# Patient Record
Sex: Female | Born: 1935 | Race: White | Hispanic: No | Smoking: Former smoker
Health system: Southern US, Community
[De-identification: ages and names within clinical notes are randomized; demographics above are authoritative.]

## PROBLEM LIST (undated history)

## (undated) DIAGNOSIS — F0390 Unspecified dementia without behavioral disturbance: Secondary | ICD-10-CM

## (undated) DIAGNOSIS — I350 Nonrheumatic aortic (valve) stenosis: Secondary | ICD-10-CM

## (undated) DIAGNOSIS — I272 Pulmonary hypertension, unspecified: Secondary | ICD-10-CM

## (undated) DIAGNOSIS — I10 Essential (primary) hypertension: Secondary | ICD-10-CM

## (undated) DIAGNOSIS — F32A Depression, unspecified: Secondary | ICD-10-CM

## (undated) DIAGNOSIS — E785 Hyperlipidemia, unspecified: Secondary | ICD-10-CM

## (undated) DIAGNOSIS — M810 Age-related osteoporosis without current pathological fracture: Secondary | ICD-10-CM

## (undated) DIAGNOSIS — I714 Abdominal aortic aneurysm, without rupture, unspecified: Secondary | ICD-10-CM

## (undated) DIAGNOSIS — M1990 Unspecified osteoarthritis, unspecified site: Secondary | ICD-10-CM

## (undated) DIAGNOSIS — F329 Major depressive disorder, single episode, unspecified: Secondary | ICD-10-CM

## (undated) DIAGNOSIS — G4733 Obstructive sleep apnea (adult) (pediatric): Secondary | ICD-10-CM

## (undated) HISTORY — PX: CATARACT EXTRACTION: SUR2

## (undated) HISTORY — PX: APPENDECTOMY: SHX54

## (undated) HISTORY — PX: HERNIA REPAIR: SHX51

---

## 2010-01-31 ENCOUNTER — Ambulatory Visit: Payer: Self-pay | Admitting: Family Medicine

## 2010-07-31 ENCOUNTER — Ambulatory Visit: Payer: Self-pay | Admitting: Internal Medicine

## 2010-12-12 ENCOUNTER — Inpatient Hospital Stay: Payer: Self-pay | Admitting: Internal Medicine

## 2012-01-11 ENCOUNTER — Ambulatory Visit: Payer: Self-pay | Admitting: Internal Medicine

## 2012-01-11 ENCOUNTER — Other Ambulatory Visit: Payer: Self-pay | Admitting: Internal Medicine

## 2012-05-11 LAB — COMPREHENSIVE METABOLIC PANEL
Albumin: 3.2 g/dL — ABNORMAL LOW (ref 3.4–5.0)
Alkaline Phosphatase: 81 U/L (ref 50–136)
Bilirubin,Total: 0.3 mg/dL (ref 0.2–1.0)
Calcium, Total: 8.8 mg/dL (ref 8.5–10.1)
Chloride: 105 mmol/L (ref 98–107)
Creatinine: 1.37 mg/dL — ABNORMAL HIGH (ref 0.60–1.30)
EGFR (Non-African Amer.): 34 — ABNORMAL LOW
Glucose: 132 mg/dL — ABNORMAL HIGH (ref 65–99)
Osmolality: 293 (ref 275–301)
Potassium: 3.6 mmol/L (ref 3.5–5.1)
SGOT(AST): 27 U/L (ref 15–37)
SGPT (ALT): 22 U/L (ref 12–78)
Total Protein: 7.2 g/dL (ref 6.4–8.2)

## 2012-05-11 LAB — CK TOTAL AND CKMB (NOT AT ARMC): CK-MB: 1.7 ng/mL (ref 0.5–3.6)

## 2012-05-11 LAB — TROPONIN I: Troponin-I: <0.02 ng/mL

## 2012-05-11 LAB — CBC
HGB: 13.8 g/dL (ref 12.0–16.0)
MCH: 31.3 pg (ref 26.0–34.0)
MCV: 96 fL (ref 80–100)

## 2012-05-12 ENCOUNTER — Inpatient Hospital Stay: Payer: Self-pay | Admitting: Internal Medicine

## 2012-05-12 LAB — URINALYSIS, COMPLETE
Bacteria: NONE SEEN
Bilirubin,UR: NEGATIVE
Glucose,UR: NEGATIVE mg/dL (ref 0–75)
Hyaline Cast: 1
Ketone: NEGATIVE
Ph: 6 (ref 4.5–8.0)
Protein: NEGATIVE
RBC,UR: 151 /HPF (ref 0–5)
Squamous Epithelial: 3
Squamous Epithelial: <1
WBC UR: 58 /HPF (ref 0–5)
WBC UR: 6 /HPF (ref 0–5)

## 2012-05-13 LAB — LIPID PANEL
Ldl Cholesterol, Calc: 89 mg/dL (ref 0–100)
Triglycerides: 64 mg/dL (ref 0–200)
VLDL Cholesterol, Calc: 13 mg/dL (ref 5–40)

## 2012-05-13 LAB — CBC WITH DIFFERENTIAL/PLATELET
Basophil #: 0.1 10*3/uL (ref 0.0–0.1)
Basophil %: 0.9 %
Eosinophil %: 4.6 %
HCT: 40 % (ref 35.0–47.0)
HGB: 13.1 g/dL (ref 12.0–16.0)
Lymphocyte #: 2.2 10*3/uL (ref 1.0–3.6)
Lymphocyte %: 33.8 %
MCHC: 32.7 g/dL (ref 32.0–36.0)
Monocyte #: 0.6 x10 3/mm (ref 0.2–0.9)
Monocyte %: 9.3 %
Neutrophil %: 51.4 %
RBC: 4.21 10*6/uL (ref 3.80–5.20)

## 2012-05-13 LAB — BASIC METABOLIC PANEL
Anion Gap: 5 — ABNORMAL LOW (ref 7–16)
BUN: 22 mg/dL — ABNORMAL HIGH (ref 7–18)
Calcium, Total: 8.5 mg/dL (ref 8.5–10.1)
Chloride: 107 mmol/L (ref 98–107)
Co2: 30 mmol/L (ref 21–32)
Creatinine: 1 mg/dL (ref 0.60–1.30)
EGFR (African American): 57 — ABNORMAL LOW
EGFR (Non-African Amer.): 50 — ABNORMAL LOW
Glucose: 86 mg/dL (ref 65–99)
Osmolality: 286 (ref 275–301)
Potassium: 4 mmol/L (ref 3.5–5.1)
Sodium: 142 mmol/L (ref 136–145)

## 2012-05-13 LAB — URINE CULTURE

## 2012-05-14 LAB — URINE CULTURE

## 2013-02-11 ENCOUNTER — Ambulatory Visit: Payer: Self-pay | Admitting: Specialist

## 2013-11-07 ENCOUNTER — Emergency Department: Payer: Self-pay | Admitting: Emergency Medicine

## 2013-11-07 LAB — COMPREHENSIVE METABOLIC PANEL
Albumin: 2.9 g/dL — ABNORMAL LOW (ref 3.4–5.0)
Alkaline Phosphatase: 64 U/L
Anion Gap: 9 (ref 7–16)
BUN: 23 mg/dL — AB (ref 7–18)
Bilirubin,Total: 0.4 mg/dL (ref 0.2–1.0)
CALCIUM: 8.8 mg/dL (ref 8.5–10.1)
CHLORIDE: 106 mmol/L (ref 98–107)
CO2: 29 mmol/L (ref 21–32)
Creatinine: 1.37 mg/dL — ABNORMAL HIGH (ref 0.60–1.30)
EGFR (African American): 39 — ABNORMAL LOW
EGFR (Non-African Amer.): 34 — ABNORMAL LOW
Glucose: 128 mg/dL — ABNORMAL HIGH (ref 65–99)
Osmolality: 292 (ref 275–301)
Potassium: 4.2 mmol/L (ref 3.5–5.1)
SGOT(AST): 23 U/L (ref 15–37)
SGPT (ALT): 16 U/L
Sodium: 144 mmol/L (ref 136–145)
TOTAL PROTEIN: 7 g/dL (ref 6.4–8.2)

## 2013-11-07 LAB — URINALYSIS, COMPLETE
Bilirubin,UR: NEGATIVE
Blood: NEGATIVE
GLUCOSE, UR: NEGATIVE mg/dL (ref 0–75)
Ketone: NEGATIVE
NITRITE: NEGATIVE
PH: 6 (ref 4.5–8.0)
Protein: NEGATIVE
SPECIFIC GRAVITY: 1.014 (ref 1.003–1.030)
Squamous Epithelial: 3
WBC UR: 7 /HPF (ref 0–5)

## 2013-11-07 LAB — CBC
HCT: 44.6 % (ref 35.0–47.0)
HGB: 14 g/dL (ref 12.0–16.0)
MCH: 30.2 pg (ref 26.0–34.0)
MCHC: 31.4 g/dL — ABNORMAL LOW (ref 32.0–36.0)
MCV: 96 fL (ref 80–100)
PLATELETS: 241 10*3/uL (ref 150–440)
RBC: 4.64 10*6/uL (ref 3.80–5.20)
RDW: 13.8 % (ref 11.5–14.5)
WBC: 6.4 10*3/uL (ref 3.6–11.0)

## 2013-11-07 LAB — TROPONIN I: Troponin-I: 0.03 ng/mL

## 2014-01-02 ENCOUNTER — Emergency Department: Payer: Self-pay | Admitting: Emergency Medicine

## 2014-01-02 LAB — COMPREHENSIVE METABOLIC PANEL
ALT: 20 U/L
ANION GAP: 8 (ref 7–16)
Albumin: 2.8 g/dL — ABNORMAL LOW (ref 3.4–5.0)
Alkaline Phosphatase: 66 U/L
BUN: 21 mg/dL — AB (ref 7–18)
Bilirubin,Total: 0.3 mg/dL (ref 0.2–1.0)
CHLORIDE: 106 mmol/L (ref 98–107)
Calcium, Total: 8.6 mg/dL (ref 8.5–10.1)
Co2: 31 mmol/L (ref 21–32)
Creatinine: 1.29 mg/dL (ref 0.60–1.30)
GFR CALC AF AMER: 50 — AB
GFR CALC NON AF AMER: 41 — AB
Glucose: 80 mg/dL (ref 65–99)
Osmolality: 291 (ref 275–301)
Potassium: 3.6 mmol/L (ref 3.5–5.1)
SGOT(AST): 21 U/L (ref 15–37)
SODIUM: 145 mmol/L (ref 136–145)
Total Protein: 6.9 g/dL (ref 6.4–8.2)

## 2014-01-02 LAB — CBC WITH DIFFERENTIAL/PLATELET
BASOS ABS: 0.1 10*3/uL (ref 0.0–0.1)
BASOS PCT: 0.6 %
Eosinophil #: 0.1 10*3/uL (ref 0.0–0.7)
Eosinophil %: 0.8 %
HCT: 42.2 % (ref 35.0–47.0)
HGB: 13.6 g/dL (ref 12.0–16.0)
LYMPHS ABS: 1.2 10*3/uL (ref 1.0–3.6)
LYMPHS PCT: 12.9 %
MCH: 31.2 pg (ref 26.0–34.0)
MCHC: 32.3 g/dL (ref 32.0–36.0)
MCV: 97 fL (ref 80–100)
Monocyte #: 0.9 x10 3/mm (ref 0.2–0.9)
Monocyte %: 9.5 %
NEUTROS PCT: 76.2 %
Neutrophil #: 7 10*3/uL — ABNORMAL HIGH (ref 1.4–6.5)
PLATELETS: 230 10*3/uL (ref 150–440)
RBC: 4.36 10*6/uL (ref 3.80–5.20)
RDW: 14.1 % (ref 11.5–14.5)
WBC: 9.2 10*3/uL (ref 3.6–11.0)

## 2014-01-02 LAB — TROPONIN I: TROPONIN-I: 0.03 ng/mL

## 2014-07-16 NOTE — H&P (Signed)
PATIENT NAME:  Alexis Boyle, Alexis Boyle MR#:  [id number] DATE OF BIRTH:  02/06/1920  DATE OF ADMISSION:  05/12/2012  REFERRING PHYSICIAN:  Chiquita LothJade Sung, MD  PRIMARY CARE PHYSICIAN: Einar CrowMarshall Anderson, MD   CHIEF COMPLAINT: Right lower extremity weakness.   HISTORY OF PRESENT ILLNESS: This is a [age] year old female, who lives with her daughter, with significant past medical history of transient ischemic attack in the past x2, hypertension, hyperlipidemia, gastritis, obstructive sleep apnea on CPAP, aortic stenosis, pulmonary hypertension, history of intermittent atrial fibrillation who presents with complaints of right lower extremity weakness. Daughter is at bedside and she gives most of the history. She reports [facility] after church. While they were leaving, usually she walks with a walker, she noticed her leaning on the right side, where she sat her down. The patient was complaining of generalized weakness. In the ED, the patient was noticed to have right lower extremity weakness by ED physician on the physical exam, which was resolved upon the time of my examination. The patient is known to have history of a TIA in the past. The patient's family gave her 324 of aspirin. The CT brain did not show any acute finding. Hospitalist service was requested to admit the patient for further management and work-up of her symptoms. The patient denies any fever or chills, cough, dysuria, polyuria, any tingling, numbness, any dysarthria, slurred speech or facial droop,   PAST MEDICAL HISTORY: 1. Hypertension.  2. Hyperlipidemia.  3. Dementia.  4. Obstructive sleep apnea.  5. Transient ischemic attack in 1988 and 1993.  6. History of gastritis.  7. Internal hemorrhoids.  8. Aortic abdominal aneurysm.  9. Mild pulmonary hypertension.  10. Osteoporosis.  11. Systolic murmur.  12. Cholecystectomy.  13. Constipation.  14. Urinary frequency. 15. Inguinal hernia. 16. Ventral hernia.   PAST SURGICAL  HISTORY:  1. Hernia surgery.  2. Cholecystectomy.  3. Rectocele in 2003.  4. Diverticulosis in 2003.  5. Cataract surgery in 2003.   FAMILY HISTORY: Significant for pancreatic cancer in her father and MI in the mother. Sister has history of breast cancer.   SOCIAL HISTORY: The patient lives with her daughter and son-in-law. Denies alcohol abuse. Does not smoke.   HOME MEDICATIONS: 1. Aspirin 81 mg daily.  2. Prilosec 20 mg daily.  3. Pravastatin 40 mg daily.  4. Flonase 1 spray in both nostrils daily.  5. Atrovent 2 sprays daily.  6. Citalopram 10 mg daily.  7. Aricept 5 mg daily.  8. Torsemide 10 to 20 mg daily.  9. Docusate   10. Vitamin D3 2000 units daily. 11. Vitamin B12 1000 units daily.  12. PreserVision 1 tablet oral daily. 13.     multivitamin 1 tablet daily.  14. Omega-3 1200 mg oral 2 times a day.   REVIEW OF SYSTEMS:  CONSTITUTIONAL: The patient denies fever or chills.  Complains of generalized weakness and fatigue.   EYES: Denies blurry vision, double vision, pain, inflammation.  ENT: Denies tinnitus, ear pain, hearing loss, snoring, discharge or epistaxis.  RESPIRATORY: Denies cough, wheezing, hemoptysis, dyspnea. Has obstructive sleep apnea.  CARDIOVASCULAR: Denies chest pain, edema, arrhythmia, palpitations, syncope.  GASTROINTESTINAL: Denies nausea, vomiting, diarrhea, abdominal pain, hematemesis.  GENITOURINARY: Denies dysarthria, hematuria, renal colic.  ENDOCRINE: Denies polyuria, polydipsia, heat or cold intolerance.  HEMATOLOGY: Denies anemia, easy bruising, bleeding diathesis.  INTEGUMENTARY: Denies acne, rash or skin lesions.  MUSCULOSKELETAL: Complains of neck pain, lower back pain. Reports limited activity, uses a walker. Denies any gout.  NEUROLOGIC: Denies seizures, headache, ataxia, vertigo, tremors, dysarthria, slurred speech. Has complaints of generalized weakness. No history of TIA in the past.  PSYCHIATRIC: Denies anxiety, insomnia, bipolar  disorder, substance or alcohol abuse.   PHYSICAL EXAMINATION: VITAL SIGNS: Temperature 98.1, pulse 48, respiratory rate 22, blood pressure 184/75, saturation 96% on room air.  GENERAL: Frail, elderly female who looks comfortable and in no apparent distress.  HEENT: Head atraumatic, normocephalic. Pupils are equal, reactive to light. Pink conjunctivae. Anicteric sclerae. Moist oral mucosa.  NECK: Supple. No thyromegaly. No JVD.  CHEST: Good air entry bilaterally. No wheezing, rales, rhonchi.  CARDIOVASCULAR: S1, S2 heard. No rubs or gallops.  ABDOMEN: Soft, nontender, nondistended. Bowel sounds are present.  EXTREMITIES: No edema. No clubbing. No cyanosis.  PSYCHIATRIC: Appropriate affect. Awake, alert x 3. Intact judgment and insight.  NEUROLOGIC: Cranial nerves are grossly intact. Motor 5 out of 5. No focal deficits noticed, but the patient when she stood up could not walk due to generalized weakness, and she could not hold herself up without assistance. CT brain does not show any acute findings.   PERTINENT LABORATORY, DIAGNOSTIC AND RADIOLOGICAL DATA:  Glucose 132, BUN 31, creatinine 1.37, sodium 143, potassium 3.6, chloride 105, CO2 30, ALT 22, AST 27, alkaline phosphatase 81. Troponin less than 0.02. White blood cells 6.8, hemoglobin 13.8, hematocrit 42.2, platelets 209.   EKG showing normal sinus rhythm.   ASSESSMENT AND PLAN: 1. Weakness, mainly in the right lower extremity:  Appears to be currently resolved, was noticed initially by ED physician, so this is most likely related to TIA.  Given patient history of paroxysmal atrial fibrillation but nothing recent, we will check 2-D echo. We will have her on a telemonitor. As well, we will check MRI of the brain and carotid Dopplers. The patient was given 324 of aspirin. We will continue with this dosage until work-up is done. As well, we will check her lipid panel, continue her on her statins. We will continue her on DVT prophylaxis. I will  consult physical therapy service. As well, the patient is generally weak for a few days regardless to the right lower weakness, so this might be due to deconditioning as well. We will check urinalysis. The patient has been in the ER for more than 4 hours without urination, so we will check a bladder scan.  2. Hyperlipidemia: We will check lipid panel. We will continue with statin.  3. Dementia: Continue with Namenda and Aricept.  4. Obstructive sleep apnea: Continue with CPAP.  5. Hypertension: The patient does not appear to be on any hypertensive medication. Her blood pressure is elevated. We will allow for this blood pressure given there is possibility of TIA/CVA. If it remains uncontrolled after 24 hours, we will start her on some hypertensive medication.  6. Deep vein thrombosis prophylaxis: Subcutaneous heparin.  7. Gastrointestinal prophylaxis: The patient is on Protonix.   CODE STATUS: Discussed with the patient's daughter at bedside.  They report she has a Living Will which states she is DNR, but currently the patient wishes to be FULL CODE. So, we will continue with FULL CODE STATUS.   TOTAL TIME SPENT ON ADMISSION AND PATIENT CARE: 60 minutes.   ____________________________ Starleen Arms, MD dse:cb D: 05/12/2012 01:30:05 ET T: 05/12/2012 09:36:46 ET JOB#: [id number]  cc: Starleen Arms, MD, <Dictator> Avaleigh Decuir Teena Irani MD ELECTRONICALLY SIGNED 05/21/2012 5:09

## 2014-10-23 ENCOUNTER — Emergency Department: Payer: Medicare Other

## 2014-10-23 ENCOUNTER — Emergency Department
Admission: EM | Admit: 2014-10-23 | Discharge: 2014-10-23 | Disposition: A | Discharge status: Home / Self Care | Payer: Medicare Other | Attending: Emergency Medicine | Admitting: Emergency Medicine

## 2014-10-23 DIAGNOSIS — Z7982 Long term (current) use of aspirin: Secondary | ICD-10-CM | POA: Insufficient documentation

## 2014-10-23 DIAGNOSIS — N3090 Cystitis, unspecified without hematuria: Secondary | ICD-10-CM | POA: Diagnosis not present

## 2014-10-23 DIAGNOSIS — Z79899 Other long term (current) drug therapy: Secondary | ICD-10-CM | POA: Diagnosis not present

## 2014-10-23 DIAGNOSIS — F0390 Unspecified dementia without behavioral disturbance: Secondary | ICD-10-CM | POA: Diagnosis not present

## 2014-10-23 DIAGNOSIS — R4182 Altered mental status, unspecified: Secondary | ICD-10-CM | POA: Diagnosis present

## 2014-10-23 DIAGNOSIS — Z792 Long term (current) use of antibiotics: Secondary | ICD-10-CM | POA: Insufficient documentation

## 2014-10-23 DIAGNOSIS — Z79811 Long term (current) use of aromatase inhibitors: Secondary | ICD-10-CM | POA: Insufficient documentation

## 2014-10-23 DIAGNOSIS — I714 Abdominal aortic aneurysm, without rupture: Secondary | ICD-10-CM | POA: Diagnosis not present

## 2014-10-23 DIAGNOSIS — I10 Essential (primary) hypertension: Secondary | ICD-10-CM | POA: Insufficient documentation

## 2014-10-23 HISTORY — DX: Abdominal aortic aneurysm, without rupture: I71.4

## 2014-10-23 HISTORY — DX: Nonrheumatic aortic (valve) stenosis: I35.0

## 2014-10-23 HISTORY — DX: Essential (primary) hypertension: I10

## 2014-10-23 HISTORY — DX: Unspecified osteoarthritis, unspecified site: M19.90

## 2014-10-23 HISTORY — DX: Unspecified dementia, unspecified severity, without behavioral disturbance, psychotic disturbance, mood disturbance, and anxiety: F03.90

## 2014-10-23 HISTORY — DX: Hyperlipidemia, unspecified: E78.5

## 2014-10-23 HISTORY — DX: Major depressive disorder, single episode, unspecified: F32.9

## 2014-10-23 HISTORY — DX: Age-related osteoporosis without current pathological fracture: M81.0

## 2014-10-23 HISTORY — DX: Pulmonary hypertension, unspecified: I27.20

## 2014-10-23 HISTORY — DX: Obstructive sleep apnea (adult) (pediatric): G47.33

## 2014-10-23 HISTORY — DX: Depression, unspecified: F32.A

## 2014-10-23 HISTORY — DX: Abdominal aortic aneurysm, without rupture, unspecified: I71.40

## 2014-10-23 LAB — LIPASE, BLOOD: Lipase: 52 U/L — ABNORMAL HIGH (ref 22–51)

## 2014-10-23 LAB — CBC WITH DIFFERENTIAL/PLATELET
Basophils Absolute: 0 10*3/uL (ref 0–0.1)
Basophils Relative: 0 %
EOS ABS: 0.1 10*3/uL (ref 0–0.7)
EOS PCT: 1 %
HCT: 44.5 % (ref 35.0–47.0)
Hemoglobin: 14.6 g/dL (ref 12.0–16.0)
Lymphocytes Relative: 9 %
Lymphs Abs: 0.9 10*3/uL — ABNORMAL LOW (ref 1.0–3.6)
MCH: 30.8 pg (ref 26.0–34.0)
MCHC: 32.8 g/dL (ref 32.0–36.0)
MCV: 93.8 fL (ref 80.0–100.0)
MONOS PCT: 8 %
Monocytes Absolute: 0.8 10*3/uL (ref 0.2–0.9)
Neutro Abs: 8.6 10*3/uL — ABNORMAL HIGH (ref 1.4–6.5)
Neutrophils Relative %: 82 %
PLATELETS: 244 10*3/uL (ref 150–440)
RBC: 4.74 MIL/uL (ref 3.80–5.20)
RDW: 13.4 % (ref 11.5–14.5)
WBC: 10.4 10*3/uL (ref 3.6–11.0)

## 2014-10-23 LAB — URINALYSIS COMPLETE WITH MICROSCOPIC (ARMC ONLY)
BACTERIA UA: NONE SEEN
Bilirubin Urine: NEGATIVE
GLUCOSE, UA: 50 mg/dL — AB
Ketones, ur: NEGATIVE mg/dL
Nitrite: NEGATIVE
Protein, ur: 100 mg/dL — AB
Specific Gravity, Urine: 1.013 (ref 1.005–1.030)
Squamous Epithelial / LPF: NONE SEEN
pH: 5 (ref 5.0–8.0)

## 2014-10-23 LAB — COMPREHENSIVE METABOLIC PANEL
ALBUMIN: 3 g/dL — AB (ref 3.5–5.0)
ALT: 15 U/L (ref 14–54)
ANION GAP: 11 (ref 5–15)
AST: 24 U/L (ref 15–41)
Alkaline Phosphatase: 64 U/L (ref 38–126)
BUN: 24 mg/dL — AB (ref 6–20)
CO2: 33 mmol/L — ABNORMAL HIGH (ref 22–32)
Calcium: 9.2 mg/dL (ref 8.9–10.3)
Chloride: 97 mmol/L — ABNORMAL LOW (ref 101–111)
Creatinine, Ser: 1.32 mg/dL — ABNORMAL HIGH (ref 0.44–1.00)
GFR calc Af Amer: 39 mL/min — ABNORMAL LOW (ref >60)
GFR calc non Af Amer: 34 mL/min — ABNORMAL LOW (ref >60)
Glucose, Bld: 159 mg/dL — ABNORMAL HIGH (ref 65–99)
POTASSIUM: 3.5 mmol/L (ref 3.5–5.1)
SODIUM: 141 mmol/L (ref 135–145)
TOTAL PROTEIN: 6.6 g/dL (ref 6.5–8.1)
Total Bilirubin: 0.5 mg/dL (ref 0.3–1.2)

## 2014-10-23 LAB — TROPONIN I: Troponin I: 0.04 ng/mL — ABNORMAL HIGH (ref <0.031)

## 2014-10-23 MED ORDER — DEXTROSE 5 % IV SOLN
1.0000 g | Freq: Once | INTRAVENOUS | Status: AC
  Administered 2014-10-23: 1 g via INTRAVENOUS
  Filled 2014-10-23: qty 10

## 2014-10-23 MED ORDER — IOHEXOL 240 MG/ML SOLN
25.0000 mL | INTRAMUSCULAR | Status: AC
  Administered 2014-10-23: 25 mL via ORAL

## 2014-10-23 MED ORDER — DEXTROSE 5 % IV SOLN
500.0000 mg | Freq: Once | INTRAVENOUS | Status: AC
  Administered 2014-10-23: 500 mg via INTRAVENOUS
  Filled 2014-10-23: qty 500

## 2014-10-23 MED ORDER — CEPHALEXIN 500 MG PO CAPS: 500.0000 mg | ORAL_CAPSULE | Freq: Three times a day (TID) | ORAL | Status: DC

## 2014-10-23 MED ORDER — SODIUM CHLORIDE 0.9 % IV BOLUS (SEPSIS)
1000.0000 mL | Freq: Once | INTRAVENOUS | Status: AC
  Administered 2014-10-23: 1000 mL via INTRAVENOUS

## 2014-10-23 NOTE — ED Notes (Signed)
Pt informed to return if any life threatening symptoms occur.  

## 2014-10-23 NOTE — ED Notes (Signed)
MD at bedside. 

## 2014-10-23 NOTE — ED Notes (Signed)
Pt presents to ED via [facility] d/t family c/o confusion and lethargy. Per EMS, family mentioned she wasn't acting normally or responding to them as she normally does. Pt arrives to ED in NAD and acting appropriately for age.

## 2014-10-23 NOTE — ED Notes (Signed)
Patient transported to CT 

## 2014-10-23 NOTE — ED Provider Notes (Signed)
[facility] Emergency Department Provider Note  ____________________________________________  Time seen: 1:35 AM  I have reviewed the triage vital signs and the nursing notes.   HISTORY  Chief Complaint Altered Mental Status    HPI Alexis Boyle is a 81 y.o. female who was noted at home that have been episode of confusion and being unresponsive. They called EMS who noted the patient seemed back to her baseline on their arrival. On arrival in the ED, and they feel the patient is also at her baseline. Patient denies any acute complaints. Denies chest pain shortness of breath headache vision change numbness tingling or weakness. No nausea vomiting or diarrhea. Denies abdominal pain.  By report from the daughter, there is strong concern that the patient has urinary tract infection. She gets UTIs frequently.     Past Medical History  Diagnosis Date  . Hypertension   . Aortic stenosis   . OSA (obstructive sleep apnea)   . Hyperlipidemia   . Osteoarthritis   . Dementia   . Osteoporosis   . AAA (abdominal aortic aneurysm)   . Pulmonary hypertension   . Depressive disorder     There are no active problems to display for this patient.   No past surgical history on file.  Current Outpatient Rx  Name  Route  Sig  Dispense  Refill  . aspirin EC 81 MG tablet   Oral   Take 81 mg by mouth daily.         . cholecalciferol (VITAMIN D) 1000 UNITS tablet   Oral   Take 2,000 Units by mouth daily.         . citalopram (CELEXA) 10 MG tablet   Oral   Take 10 mg by mouth daily.         Marland Kitchen docusate sodium (COLACE) 100 MG capsule   Oral   Take 100 mg by mouth 2 (two) times daily.         . fluticasone (FLONASE) 50 MCG/ACT nasal spray   Each Nare   Place 2 sprays into both nostrils daily.         . Multiple Vitamin (MULTIVITAMIN WITH MINERALS) TABS tablet   Oral   Take 1 tablet by mouth daily.         . Multiple Vitamins-Minerals  (PRESERVISION AREDS 2 PO)   Oral   Take 1 tablet by mouth daily.         . Omega 3 1000 MG CAPS   Oral   Take 1,000 mg by mouth 2 (two) times daily.         Marland Kitchen omeprazole (PRILOSEC) 20 MG capsule   Oral   Take 20 mg by mouth daily.         . pravastatin (PRAVACHOL) 40 MG tablet   Oral   Take 40 mg by mouth daily.         Marland Kitchen torsemide (DEMADEX) 10 MG tablet   Oral   Take 20 mg by mouth daily.         . vitamin E (VITAMIN E) 400 UNIT capsule   Oral   Take 400 Units by mouth daily.         . cephALEXin (KEFLEX) 500 MG capsule   Oral   Take 1 capsule (500 mg total) by mouth 3 (three) times daily.   21 capsule   0     Allergies Sulfa antibiotics  No family history on file.  Social History History  Substance Use  Topics  . Smoking status: Not on file  . Smokeless tobacco: Not on file  . Alcohol Use: Not on file   no tobacco alcohol or drug use  Review of Systems  Constitutional: No fever or chills. No weight changes Eyes:No blurry vision or double vision.  ENT: No sore throat. Cardiovascular: No chest pain. Respiratory: No dyspnea or cough. Gastrointestinal: Negative for abdominal pain, vomiting and diarrhea.  No BRBPR or melena. Genitourinary: Dark cloudy urine. Musculoskeletal: Negative for back pain. No joint swelling or pain. Skin: Negative for rash. Neurological: Negative for headaches, focal weakness or numbness. Psychiatric:No anxiety or depression.   Endocrine:No hot/cold intolerance, changes in energy, or sleep difficulty.  10-point ROS otherwise negative.  ____________________________________________   PHYSICAL EXAM:  VITAL SIGNS: ED Triage Vitals  Enc Vitals Group     BP 10/23/14 0135 126/55 mmHg     Pulse Rate 10/23/14 0135 68     Resp 10/23/14 0135 16     Temp 10/23/14 0135 97.7 F (36.5 C)     Temp Source 10/23/14 0135 Oral     SpO2 10/23/14 0132 94 %     Weight 10/23/14 0135 106 lb (48.081 kg)     Height 10/23/14 0135  5\' 2"  (1.575 m)     Head Cir --      Peak Flow --      Pain Score 10/23/14 0136 0     Pain Loc --      Pain Edu? --      Excl. in GC? --      Constitutional: Alert and oriented to self. Well appearing and in no distress. Eyes: No scleral icterus. No conjunctival pallor. PERRL. EOMI ENT   Head: Normocephalic and atraumatic.   Nose: No congestion/rhinnorhea. No septal hematoma   Mouth/Throat: Dry mucous membranes, no pharyngeal erythema. No peritonsillar mass. No uvula shift.   Neck: No stridor. No SubQ emphysema. No meningismus. Hematological/Lymphatic/Immunilogical: No cervical lymphadenopathy. Cardiovascular: RRR. Normal and symmetric distal pulses are present in all extremities. No murmurs, rubs, or gallops. Respiratory: Normal respiratory effort without tachypnea nor retractions. Breath sounds are clear and equal bilaterally. No wheezes/rales/rhonchi. Gastrointestinal: Soft with mild epigastric tenderness. No distention. There is no CVA tenderness.  No rebound, rigidity, or guarding. Palpable aortic pulsations, no pulsatile mass, no bruit Genitourinary: deferred Musculoskeletal: Nontender with normal range of motion in all extremities. No joint effusions.  No lower extremity tenderness.  No edema. Neurologic:   Normal speech and language.  CN 2-10 normal. Motor grossly intact. No pronator drift.  Normal gait. No gross focal neurologic deficits are appreciated.  Skin:  Skin is warm, dry and intact. No rash noted.  No petechiae, purpura, or bullae. Poor skin turgor Psychiatric: Mood and affect are normal. Speech and behavior are normal. Patient exhibits appropriate insight and judgment.  ____________________________________________    LABS (pertinent positives/negatives) (all labs ordered are listed, but only abnormal results are displayed) Labs Reviewed  COMPREHENSIVE METABOLIC PANEL - Abnormal; Notable for the following:    Chloride 97 (*)    CO2 33 (*)     Glucose, Bld 159 (*)    BUN 24 (*)    Creatinine, Ser 1.32 (*)    Albumin 3.0 (*)    GFR calc non Af Amer 34 (*)    GFR calc Af Amer 39 (*)    All other components within normal limits  LIPASE, BLOOD - Abnormal; Notable for the following:    Lipase 52 (*)  All other components within normal limits  TROPONIN I - Abnormal; Notable for the following:    Troponin I 0.04 (*)    All other components within normal limits  CBC WITH DIFFERENTIAL/PLATELET - Abnormal; Notable for the following:    Neutro Abs 8.6 (*)    Lymphs Abs 0.9 (*)    All other components within normal limits  URINALYSIS COMPLETEWITH MICROSCOPIC ([facility] ONLY) - Abnormal; Notable for the following:    Color, Urine AMBER (*)    APPearance TURBID (*)    Glucose, UA 50 (*)    Hgb urine dipstick 2+ (*)    Protein, ur 100 (*)    Leukocytes, UA 3+ (*)    All other components within normal limits  URINE CULTURE   urine white blood cell too numerous to count, urine red blood cell too numerous to count ____________________________________________   EKG  Interpreted by me Sinus rhythm rate of 65, normal axis intervals QRS and ST segments and T waves  ____________________________________________    RADIOLOGY  CT head unremarkable Chest x-ray unremarkable CT abdomen and pelvis with contrast unremarkable  ____________________________________________   PROCEDURES  ____________________________________________   INITIAL IMPRESSION / ASSESSMENT AND PLAN / ED COURSE  Pertinent labs & imaging results that were available during my care of the patient were reviewed by me and considered in my medical decision making (see chart for details).  Patient presents with an episode of altered mental status back to baseline on arrival in the ED. Extensive workup reveals a urinary tract infection it was expected per the family. This happens a lot to her. Low suspicion for ACS PE TAD pneumothorax carditis AAA rupture stroke or  intracranial hemorrhage. No evidence of sepsis. Slightly elevated troponin most likely artifactual and does not require further workup at this time. Patient is stable for discharge home with normal vital signs and follow-up with primary care, will prescribe Keflex. Given ceftriaxone and azithromycin and 82  ____________________________________________   FINAL CLINICAL IMPRESSION(S) / ED DIAGNOSES  Final diagnoses:  Cystitis      Sharman Cheek, MD 10/23/14 [phone]

## 2014-10-23 NOTE — ED Notes (Signed)
Dr Stafford notified of elevated troponin 0.04 

## 2014-10-23 NOTE — Discharge Instructions (Signed)

## 2014-10-25 LAB — URINE CULTURE: Culture: 30000

## 2014-12-19 ENCOUNTER — Inpatient Hospital Stay
Admission: EM | Admit: 2014-12-19 | Discharge: 2014-12-21 | DRG: 308 | Disposition: A | Discharge status: Home / Self Care | Payer: Medicare Other | Attending: Internal Medicine | Admitting: Internal Medicine

## 2014-12-19 ENCOUNTER — Encounter: Payer: Self-pay | Admitting: Emergency Medicine

## 2014-12-19 DIAGNOSIS — I4891 Unspecified atrial fibrillation: Secondary | ICD-10-CM

## 2014-12-19 DIAGNOSIS — E785 Hyperlipidemia, unspecified: Secondary | ICD-10-CM | POA: Diagnosis present

## 2014-12-19 DIAGNOSIS — Z9181 History of falling: Secondary | ICD-10-CM

## 2014-12-19 DIAGNOSIS — I350 Nonrheumatic aortic (valve) stenosis: Secondary | ICD-10-CM | POA: Diagnosis present

## 2014-12-19 DIAGNOSIS — Z9889 Other specified postprocedural states: Secondary | ICD-10-CM

## 2014-12-19 DIAGNOSIS — Z7901 Long term (current) use of anticoagulants: Secondary | ICD-10-CM

## 2014-12-19 DIAGNOSIS — Z882 Allergy status to sulfonamides status: Secondary | ICD-10-CM

## 2014-12-19 DIAGNOSIS — R569 Unspecified convulsions: Secondary | ICD-10-CM

## 2014-12-19 DIAGNOSIS — I480 Paroxysmal atrial fibrillation: Secondary | ICD-10-CM | POA: Diagnosis not present

## 2014-12-19 DIAGNOSIS — G4733 Obstructive sleep apnea (adult) (pediatric): Secondary | ICD-10-CM | POA: Diagnosis present

## 2014-12-19 DIAGNOSIS — Z9049 Acquired absence of other specified parts of digestive tract: Secondary | ICD-10-CM | POA: Diagnosis present

## 2014-12-19 DIAGNOSIS — M810 Age-related osteoporosis without current pathological fracture: Secondary | ICD-10-CM | POA: Diagnosis present

## 2014-12-19 DIAGNOSIS — J189 Pneumonia, unspecified organism: Secondary | ICD-10-CM | POA: Diagnosis present

## 2014-12-19 DIAGNOSIS — F0280 Dementia in other diseases classified elsewhere without behavioral disturbance: Secondary | ICD-10-CM | POA: Diagnosis present

## 2014-12-19 DIAGNOSIS — G309 Alzheimer's disease, unspecified: Secondary | ICD-10-CM | POA: Diagnosis present

## 2014-12-19 DIAGNOSIS — Z79899 Other long term (current) drug therapy: Secondary | ICD-10-CM

## 2014-12-19 DIAGNOSIS — Z9849 Cataract extraction status, unspecified eye: Secondary | ICD-10-CM

## 2014-12-19 DIAGNOSIS — Z8249 Family history of ischemic heart disease and other diseases of the circulatory system: Secondary | ICD-10-CM

## 2014-12-19 DIAGNOSIS — M1990 Unspecified osteoarthritis, unspecified site: Secondary | ICD-10-CM | POA: Diagnosis present

## 2014-12-19 DIAGNOSIS — I10 Essential (primary) hypertension: Secondary | ICD-10-CM | POA: Diagnosis present

## 2014-12-19 DIAGNOSIS — Z7982 Long term (current) use of aspirin: Secondary | ICD-10-CM

## 2014-12-19 DIAGNOSIS — Z87440 Personal history of urinary (tract) infections: Secondary | ICD-10-CM

## 2014-12-19 DIAGNOSIS — Z961 Presence of intraocular lens: Secondary | ICD-10-CM | POA: Diagnosis present

## 2014-12-19 DIAGNOSIS — Z66 Do not resuscitate: Secondary | ICD-10-CM | POA: Diagnosis present

## 2014-12-19 NOTE — ED Notes (Signed)
Pt arrives via EMS from home with c/o tremors witness by family.  Family reports tremors for a few seconds with a blank stare.  Pt a/o x4 for EMS and upon arrival.  Pt with hx of chronic UTI's and finished Cipro on Thursday.

## 2014-12-20 ENCOUNTER — Observation Stay (HOSPITAL_BASED_OUTPATIENT_CLINIC_OR_DEPARTMENT_OTHER)
Admit: 2014-12-20 | Discharge: 2014-12-20 | Disposition: A | Discharge status: Home / Self Care | Payer: Medicare Other | Attending: Internal Medicine | Admitting: Internal Medicine

## 2014-12-20 ENCOUNTER — Emergency Department: Payer: Medicare Other

## 2014-12-20 ENCOUNTER — Encounter: Payer: Self-pay | Admitting: Internal Medicine

## 2014-12-20 DIAGNOSIS — J189 Pneumonia, unspecified organism: Secondary | ICD-10-CM | POA: Diagnosis present

## 2014-12-20 DIAGNOSIS — I4891 Unspecified atrial fibrillation: Secondary | ICD-10-CM | POA: Diagnosis not present

## 2014-12-20 LAB — COMPREHENSIVE METABOLIC PANEL
ALBUMIN: 2.9 g/dL — AB (ref 3.5–5.0)
ALK PHOS: 65 U/L (ref 38–126)
ALT: 15 U/L (ref 14–54)
AST: 23 U/L (ref 15–41)
Anion gap: 9 (ref 5–15)
BUN: 20 mg/dL (ref 6–20)
CALCIUM: 9.1 mg/dL (ref 8.9–10.3)
CO2: 30 mmol/L (ref 22–32)
CREATININE: 1.07 mg/dL — AB (ref 0.44–1.00)
Chloride: 101 mmol/L (ref 101–111)
GFR calc Af Amer: 51 mL/min — ABNORMAL LOW (ref >60)
GFR calc non Af Amer: 44 mL/min — ABNORMAL LOW (ref >60)
GLUCOSE: 126 mg/dL — AB (ref 65–99)
Potassium: 3.6 mmol/L (ref 3.5–5.1)
Sodium: 140 mmol/L (ref 135–145)
Total Bilirubin: 0.8 mg/dL (ref 0.3–1.2)
Total Protein: 6.6 g/dL (ref 6.5–8.1)

## 2014-12-20 LAB — CBC
HEMATOCRIT: 41.4 % (ref 35.0–47.0)
HEMOGLOBIN: 13.8 g/dL (ref 12.0–16.0)
MCH: 31 pg (ref 26.0–34.0)
MCHC: 33.3 g/dL (ref 32.0–36.0)
MCV: 93.1 fL (ref 80.0–100.0)
Platelets: 259 10*3/uL (ref 150–440)
RBC: 4.45 MIL/uL (ref 3.80–5.20)
RDW: 13.9 % (ref 11.5–14.5)
WBC: 10.2 10*3/uL (ref 3.6–11.0)

## 2014-12-20 LAB — URINALYSIS COMPLETE WITH MICROSCOPIC (ARMC ONLY)
Bilirubin Urine: NEGATIVE
Glucose, UA: NEGATIVE mg/dL
Hgb urine dipstick: NEGATIVE
KETONES UR: NEGATIVE mg/dL
Nitrite: NEGATIVE
PH: 5 (ref 5.0–8.0)
PROTEIN: NEGATIVE mg/dL
Specific Gravity, Urine: 1.008 (ref 1.005–1.030)

## 2014-12-20 LAB — TROPONIN I
Troponin I: 0.05 ng/mL — ABNORMAL HIGH (ref <0.031)
Troponin I: 0.07 ng/mL — ABNORMAL HIGH (ref <0.031)

## 2014-12-20 LAB — BRAIN NATRIURETIC PEPTIDE: B NATRIURETIC PEPTIDE 5: 208 pg/mL — AB (ref 0.0–100.0)

## 2014-12-20 LAB — MAGNESIUM: MAGNESIUM: 1.9 mg/dL (ref 1.7–2.4)

## 2014-12-20 LAB — TSH: TSH: 0.896 u[IU]/mL (ref 0.350–4.500)

## 2014-12-20 MED ORDER — PANTOPRAZOLE SODIUM 40 MG PO TBEC
40.0000 mg | DELAYED_RELEASE_TABLET | Freq: Every day | ORAL | Status: DC
  Administered 2014-12-20: 40 mg via ORAL
  Filled 2014-12-20 (×3): qty 1

## 2014-12-20 MED ORDER — ACETAMINOPHEN 325 MG PO TABS: 650.0000 mg | ORAL_TABLET | Freq: Four times a day (QID) | ORAL | Status: DC | PRN

## 2014-12-20 MED ORDER — OMEGA-3-ACID ETHYL ESTERS 1 G PO CAPS
1.0000 g | ORAL_CAPSULE | Freq: Two times a day (BID) | ORAL | Status: DC
  Administered 2014-12-20 (×2): 1 g via ORAL
  Filled 2014-12-20 (×4): qty 1

## 2014-12-20 MED ORDER — TORSEMIDE 20 MG PO TABS
10.0000 mg | ORAL_TABLET | Freq: Every day | ORAL | Status: DC
  Administered 2014-12-20: 10 mg via ORAL
  Filled 2014-12-20: qty 2

## 2014-12-20 MED ORDER — ASPIRIN EC 81 MG PO TBEC
81.0000 mg | DELAYED_RELEASE_TABLET | Freq: Every day | ORAL | Status: DC
  Administered 2014-12-20: 81 mg via ORAL
  Filled 2014-12-20: qty 1

## 2014-12-20 MED ORDER — HEPARIN SODIUM (PORCINE) 5000 UNIT/ML IJ SOLN
5000.0000 [IU] | Freq: Three times a day (TID) | INTRAMUSCULAR | Status: DC
  Administered 2014-12-20 – 2014-12-21 (×4): 5000 [IU] via SUBCUTANEOUS
  Filled 2014-12-20 (×5): qty 1

## 2014-12-20 MED ORDER — PRAVASTATIN SODIUM 20 MG PO TABS
40.0000 mg | ORAL_TABLET | Freq: Every day | ORAL | Status: DC
  Administered 2014-12-20: 40 mg via ORAL
  Filled 2014-12-20: qty 2

## 2014-12-20 MED ORDER — MORPHINE SULFATE (PF) 2 MG/ML IV SOLN: 1.0000 mg | INTRAVENOUS | Status: DC | PRN

## 2014-12-20 MED ORDER — CITALOPRAM HYDROBROMIDE 20 MG PO TABS
10.0000 mg | ORAL_TABLET | Freq: Every day | ORAL | Status: DC
  Administered 2014-12-20: 10 mg via ORAL
  Filled 2014-12-20: qty 1

## 2014-12-20 MED ORDER — SODIUM CHLORIDE 0.9 % IV BOLUS (SEPSIS)
500.0000 mL | Freq: Once | INTRAVENOUS | Status: AC
  Administered 2014-12-20: 500 mL via INTRAVENOUS

## 2014-12-20 MED ORDER — VITAMIN E 180 MG (400 UNIT) PO CAPS
400.0000 [IU] | ORAL_CAPSULE | Freq: Every day | ORAL | Status: DC
  Administered 2014-12-20: 400 [IU] via ORAL
  Filled 2014-12-20 (×3): qty 1

## 2014-12-20 MED ORDER — ONDANSETRON HCL 4 MG PO TABS: 4.0000 mg | ORAL_TABLET | Freq: Four times a day (QID) | ORAL | Status: DC | PRN

## 2014-12-20 MED ORDER — DOCUSATE SODIUM 100 MG PO CAPS
100.0000 mg | ORAL_CAPSULE | Freq: Two times a day (BID) | ORAL | Status: DC
  Administered 2014-12-20 (×2): 100 mg via ORAL
  Filled 2014-12-20 (×4): qty 1

## 2014-12-20 MED ORDER — DILTIAZEM HCL 25 MG/5ML IV SOLN
10.0000 mg | Freq: Once | INTRAVENOUS | Status: AC
  Administered 2014-12-20: 10 mg via INTRAVENOUS
  Filled 2014-12-20: qty 5

## 2014-12-20 MED ORDER — OMEGA 3 1000 MG PO CAPS: 1000.0000 mg | ORAL_CAPSULE | Freq: Two times a day (BID) | ORAL | Status: DC

## 2014-12-20 MED ORDER — ACETAMINOPHEN 650 MG RE SUPP: 650.0000 mg | Freq: Four times a day (QID) | RECTAL | Status: DC | PRN

## 2014-12-20 MED ORDER — VITAMIN B-12 1000 MCG PO TABS
1000.0000 ug | ORAL_TABLET | Freq: Every day | ORAL | Status: DC
  Administered 2014-12-20: 1000 ug via ORAL
  Filled 2014-12-20 (×3): qty 1

## 2014-12-20 MED ORDER — ONDANSETRON HCL 4 MG/2ML IJ SOLN: 4.0000 mg | Freq: Four times a day (QID) | INTRAMUSCULAR | Status: DC | PRN

## 2014-12-20 MED ORDER — LEVOFLOXACIN IN D5W 750 MG/150ML IV SOLN
750.0000 mg | INTRAVENOUS | Status: DC
  Administered 2014-12-20: 750 mg via INTRAVENOUS
  Filled 2014-12-20 (×2): qty 150

## 2014-12-20 MED ORDER — SODIUM CHLORIDE 0.9 % IV SOLN
INTRAVENOUS | Status: DC
  Administered 2014-12-20 (×2): via INTRAVENOUS

## 2014-12-20 MED ORDER — OCUVITE-LUTEIN PO CAPS
1.0000 | ORAL_CAPSULE | Freq: Two times a day (BID) | ORAL | Status: DC
  Administered 2014-12-20 (×2): 1 via ORAL
  Filled 2014-12-20 (×4): qty 1

## 2014-12-20 MED ORDER — INFLUENZA VAC SPLIT QUAD 0.5 ML IM SUSY: 0.5000 mL | PREFILLED_SYRINGE | INTRAMUSCULAR | Status: DC

## 2014-12-20 MED ORDER — VITAMIN D 1000 UNITS PO TABS
2000.0000 [IU] | ORAL_TABLET | Freq: Every day | ORAL | Status: DC
  Administered 2014-12-20: 2000 [IU] via ORAL
  Filled 2014-12-20: qty 2

## 2014-12-20 MED ORDER — FLUTICASONE PROPIONATE 50 MCG/ACT NA SUSP
2.0000 | Freq: Every day | NASAL | Status: DC
  Filled 2014-12-20: qty 16

## 2014-12-20 MED ORDER — SODIUM CHLORIDE 0.9 % IJ SOLN
3.0000 mL | Freq: Two times a day (BID) | INTRAMUSCULAR | Status: DC
  Administered 2014-12-20: 3 mL via INTRAVENOUS

## 2014-12-20 MED ORDER — IPRATROPIUM BROMIDE 0.03 % NA SOLN
2.0000 | Freq: Every day | NASAL | Status: DC
  Filled 2014-12-20: qty 30

## 2014-12-20 MED ORDER — ADULT MULTIVITAMIN W/MINERALS CH
1.0000 | ORAL_TABLET | Freq: Every day | ORAL | Status: DC
  Administered 2014-12-20: 1 via ORAL
  Filled 2014-12-20 (×3): qty 1

## 2014-12-20 NOTE — Consult Note (Signed)
[facility] Cardiology  CARDIOLOGY CONSULT NOTE  Patient ID: Alexis Boyle MRN: [id number] DOB/AGE: [birth year] 81 y.o.  Admit date: 12/19/2014 Referring Physician Nemiah Commander Primary Physician [facility] Primary Cardiologist Fath Reason for Consultation atrial fibrillation  HPI: 81 year old female referred for evaluation of atrial fibrillation.according to the patient's daughter, the patient has had a recent urinary tract infection. The patient experienced tremors, generalized weakness and decreased by mouth intake and was brought to [facility] emergency room. Chest x-ray revealed evidence for left lung pneumonia. The patient was noted to be tachycardic, EKG revealed atrial fibrillation with a rapid ventricular rate. The patient was treated with intravenous Cardizem, and converted to sinus rhythm. The patient has remained in sinus rhythm. Patient has Alzheimer's dementia, and denies chest pain, palpitations or shortness of breath. Patient does have a history of intermittent atrial fibrillation. Due to patient's advanced age, increased falling risk, and dementia, chronic anticoagulation has been deferred.according to the daughter, the patient has a bed to couch existence, ambulates with a walker, and has recently fallen 3 times.  Review of systems complete and found to be negative unless listed above     Past Medical History  Diagnosis Date  . Hypertension   . Aortic stenosis   . OSA (obstructive sleep apnea)   . Hyperlipidemia   . Osteoarthritis   . Dementia   . Osteoporosis   . AAA (abdominal aortic aneurysm)   . Pulmonary hypertension   . Depressive disorder     Past Surgical History  Procedure Laterality Date  . Appendectomy    . Cataract extraction    . Hernia repair      ventral + inguinal    Prescriptions prior to admission  Medication Sig Dispense Refill Last Dose  . aspirin EC 81 MG tablet Take 81 mg by mouth at bedtime.    Past Week at Unknown time  . cholecalciferol (VITAMIN D) 1000  UNITS tablet Take 2,000 Units by mouth at bedtime.    Past Week at Unknown time  . citalopram (CELEXA) 10 MG tablet Take 10 mg by mouth at bedtime.    Past Week at Unknown time  . docusate sodium (COLACE) 100 MG capsule Take 100 mg by mouth 2 (two) times daily.   12/19/2014 at Unknown time  . fluticasone (FLONASE) 50 MCG/ACT nasal spray Place 2 sprays into both nostrils daily.   12/19/2014 at Unknown time  . ipratropium (ATROVENT) 0.03 % nasal spray Place 2 sprays into both nostrils at bedtime.   Past Week at Unknown time  . Multiple Vitamin (MULTIVITAMIN WITH MINERALS) TABS tablet Take 1 tablet by mouth daily.   12/19/2014 at Unknown time  . Multiple Vitamins-Minerals (PRESERVISION AREDS 2 PO) Take 1 tablet by mouth 2 (two) times daily.    12/19/2014 at Unknown time  . Omega 3 1000 MG CAPS Take 1,000 mg by mouth 2 (two) times daily.   12/19/2014 at Unknown time  . omeprazole (PRILOSEC) 20 MG capsule Take 20 mg by mouth daily.   12/19/2014 at Unknown time  . pravastatin (PRAVACHOL) 40 MG tablet Take 40 mg by mouth at bedtime.    Past Week at Unknown time  . torsemide (DEMADEX) 10 MG tablet Take 10 mg by mouth daily.    12/19/2014 at Unknown time  . vitamin B-12 (CYANOCOBALAMIN) 1000 MCG tablet Take 1,000 mcg by mouth daily.   12/19/2014 at Unknown time  . vitamin E (VITAMIN E) 400 UNIT capsule Take 400 Units by mouth daily.   12/19/2014 at Unknown time  .  cephALEXin (KEFLEX) 500 MG capsule Take 1 capsule (500 mg total) by mouth 3 (three) times daily. (Patient not taking: Reported on 12/20/2014) 21 capsule 0 Completed Course at Unknown time   Social History   Social History  . Marital Status: Widowed    Spouse Name: N/A  . Number of Children: N/A  . Years of Education: N/A   Occupational History  . Not on file.   Social History Main Topics  . Smoking status: Former Games developer  . Smokeless tobacco: Not on file  . Alcohol Use: Not on file  . Drug Use: Not on file  . Sexual Activity: Not on file    Other Topics Concern  . Not on file   Social History Narrative    Family History  Problem Relation Age of Onset  . CAD Mother       Review of systems complete and found to be negative unless listed above      PHYSICAL EXAM  General: Well developed, well nourished, in no acute distress HEENT:  Normocephalic and atramatic Neck:  No JVD.  Lungs: Clear bilaterally to auscultation and percussion. Heart: HRRR . Normal S1 and S2 without gallops or murmurs.  Abdomen: Bowel sounds are positive, abdomen soft and non-tender  Msk:  Back normal, normal gait. Normal strength and tone for age. Extremities: No clubbing, cyanosis or edema.   Neuro: Alert and oriented X 3. Psych:  Good affect, responds appropriately  Labs:   Lab Results  Component Value Date   WBC 10.2 12/19/2014   HGB 13.8 12/19/2014   HCT 41.4 12/19/2014   MCV 93.1 12/19/2014   PLT 259 12/19/2014    Recent Labs Lab 12/19/14 2352  NA 140  K 3.6  CL 101  CO2 30  BUN 20  CREATININE 1.07*  CALCIUM 9.1  PROT 6.6  BILITOT 0.8  ALKPHOS 65  ALT 15  AST 23  GLUCOSE 126*   Lab Results  Component Value Date   TROPONINI 0.07* 12/20/2014    Lab Results  Component Value Date   CHOL 164 05/13/2012   Lab Results  Component Value Date   HDL 62* 05/13/2012   Lab Results  Component Value Date   LDLCALC 89 05/13/2012   Lab Results  Component Value Date   TRIG 64 05/13/2012   No results found for: CHOLHDL No results found for: LDLDIRECT    Radiology: Ct Head Wo Contrast  12/20/2014   CLINICAL DATA:  Seizure-like episode.  Shaking and weakness.  EXAM: CT HEAD WITHOUT CONTRAST  TECHNIQUE: Contiguous axial images were obtained from the base of the skull through the vertex without intravenous contrast.  COMPARISON:  10/23/2014  FINDINGS: Skull and Sinuses:No notable swelling.  No evidence of fracture.  Left sphenoid sinus effusion.  Orbits: No traumatic finding.  Bilateral cataract resection.  Brain: No  evidence of acute infarction, hemorrhage, hydrocephalus, or mass lesion/mass effect.  Extensive chronic small vessel disease with multiple remote lacunar infarcts, most discrete in the right thalamus and left caudate head. Remote cortical and subcortical infarct in the parasagittal right occipital lobe.  Visible right MCA trifurcation aneurysm. No subarachnoid hemorrhage or brain edema.  IMPRESSION: 1. No acute finding. 2. Extensive remote ischemic injury, including the right occipital cortex. 3. Known right MCA bifurcation aneurysm. 4. Left sphenoid sinus effusion.   Electronically Signed   By: Marnee Spring M.D.   On: 12/20/2014 00:30   Dg Chest Portable 1 View  12/20/2014   CLINICAL DATA:  Tremors.  Evaluate pulmonary edema. History of aortic stenosis.  EXAM: PORTABLE CHEST 1 VIEW  COMPARISON:  10/23/2014  FINDINGS: Cardiomediastinal contours are unchanged, unchanged borderline cardiomegaly. No pulmonary edema. Persistent ill-defined left lung base opacity with blunting of the costophrenic angle. Right lung is clear. Left apical scarring is again seen. No pneumothorax. The lungs remain hyperinflated. No acute osseous abnormalities are seen.  IMPRESSION: 1. No pulmonary edema. 2. Ill-defined opacity at the left lung base, appears minimally increased from prior exam. Likely related to atelectasis, small developing pleural effusion is not excluded.   Electronically Signed   By: Rubye Oaks M.D.   On: 12/20/2014 03:31    EKG: sinus rhythm with alternating atrial fibrillation with a rapid ventricular rate  ASSESSMENT AND PLAN:   Paroxysmal atrial fibrillation, chads Vasc of 4,currently in sinus rhythm.she appears to be asymptomatic from a cardiovascular perspective. Patient has significant Alzheimer's dementia, with increased falling risk.  Recommendations  1. Agree with overall current therapy 2. Continue aspirin for stroke prevention 3. Defer chronic anticoagulation 4. Review 2-D  echocardiogram   Signed: Queen Abbett MD,PhD, [facility] 12/20/2014, 5:12 PM

## 2014-12-20 NOTE — ED Provider Notes (Signed)
[facility] Emergency Department Provider Note  ____________________________________________  Time seen: Approximately 2325 PM  I have reviewed the triage vital signs and the nursing notes.   HISTORY  Chief Complaint Tremors    HPI Alexis Boyle is a 81 y.o. female who was brought in today with some tremors. According to the patient's daughter she recently got over a urinary tract infection which was discovered after multiple falls. The patient has completed her antibiotics has been feeling weak since then. Tonight the patient was slowly eating dinner and she started staring off. According to her daughter she's turned white and started shaking as if she was having convulsions. She was not responding just staring. The patient knocked over her cup but by the time the paramedics arrived the patient seemed to be doing well with no complaints. She did not have any chest pain did not pass out or become unconscious. The patient has been eating and drinking at her baseline but does have a history of Alzheimer's dementiathat she does not eat well. The patient is also been using her wheelchair more and her strength has not returned since the urinary tract infection. The patient's daughter has been holding her blood pressure medications as her blood pressure has been in the low 100s. The patient's daughter was concerned she may have another urinary tract infection so she decided to bring her in for evaluation.   Past Medical History  Diagnosis Date  . Hypertension   . Aortic stenosis   . OSA (obstructive sleep apnea)   . Hyperlipidemia   . Osteoarthritis   . Dementia   . Osteoporosis   . AAA (abdominal aortic aneurysm)   . Pulmonary hypertension   . Depressive disorder     There are no active problems to display for this patient.   History reviewed. No pertinent past surgical history.  Current Outpatient Rx  Name  Route  Sig  Dispense  Refill  . aspirin EC 81  MG tablet   Oral   Take 81 mg by mouth at bedtime.          . cholecalciferol (VITAMIN D) 1000 UNITS tablet   Oral   Take 2,000 Units by mouth at bedtime.          . citalopram (CELEXA) 10 MG tablet   Oral   Take 10 mg by mouth at bedtime.          . docusate sodium (COLACE) 100 MG capsule   Oral   Take 100 mg by mouth 2 (two) times daily.         . fluticasone (FLONASE) 50 MCG/ACT nasal spray   Each Nare   Place 2 sprays into both nostrils daily.         Marland Kitchen ipratropium (ATROVENT) 0.03 % nasal spray   Each Nare   Place 2 sprays into both nostrils at bedtime.         . Multiple Vitamin (MULTIVITAMIN WITH MINERALS) TABS tablet   Oral   Take 1 tablet by mouth daily.         . Multiple Vitamins-Minerals (PRESERVISION AREDS 2 PO)   Oral   Take 1 tablet by mouth 2 (two) times daily.          . Omega 3 1000 MG CAPS   Oral   Take 1,000 mg by mouth 2 (two) times daily.         Marland Kitchen omeprazole (PRILOSEC) 20 MG capsule   Oral   Take  20 mg by mouth daily.         . pravastatin (PRAVACHOL) 40 MG tablet   Oral   Take 40 mg by mouth at bedtime.          . torsemide (DEMADEX) 10 MG tablet   Oral   Take 10 mg by mouth daily.          . vitamin B-12 (CYANOCOBALAMIN) 1000 MCG tablet   Oral   Take 1,000 mcg by mouth daily.         . vitamin E (VITAMIN E) 400 UNIT capsule   Oral   Take 400 Units by mouth daily.         . cephALEXin (KEFLEX) 500 MG capsule   Oral   Take 1 capsule (500 mg total) by mouth 3 (three) times daily. Patient not taking: Reported on 12/20/2014   21 capsule   0     Allergies Sulfa antibiotics  History reviewed. No pertinent family history.  Social History Social History  Substance Use Topics  . Smoking status: Unknown If Ever Smoked  . Smokeless tobacco: None  . Alcohol Use: None    Review of Systems Constitutional: No fever/chills Eyes: No visual changes. ENT: No sore throat. Cardiovascular: Denies chest  pain. Respiratory: Denies shortness of breath. Gastrointestinal: No abdominal pain.  No nausea, no vomiting.  No diarrhea.  No constipation. Genitourinary: Negative for dysuria. Musculoskeletal: Negative for back pain. Skin: Negative for rash. Neurological: convulsions  10-point ROS otherwise negative.  ____________________________________________   PHYSICAL EXAM:  VITAL SIGNS: ED Triage Vitals  Enc Vitals Group     BP 12/19/14 2237 145/52 mmHg     Pulse Rate 12/19/14 2237 60     Resp 12/19/14 2237 16     Temp 12/19/14 2234 97.6 F (36.4 C)     Temp Source 12/19/14 2234 Oral     SpO2 12/19/14 2237 96 %     Weight --      Height --      Head Cir --      Peak Flow --      Pain Score --      Pain Loc --      Pain Edu? --      Excl. in GC? --     Constitutional: Alert and and confused as per basline Well appearing and in no acute distress. Eyes: Conjunctivae are normal. PERRL. EOMI. Head: Atraumatic. Nose: No congestion/rhinnorhea. Mouth/Throat: Mucous membranes are moist.  Oropharynx non-erythematous. Cardiovascular: irregular rate and rhythm. Systolic murmur  Good peripheral circulation. Respiratory: Normal respiratory effort.  No retractions. Lungs CTAB. Gastrointestinal: Soft and nontender. No distention. Positive bowel sounds Musculoskeletal: No lower extremity tenderness nor edema.   Neurologic:  Normal speech and language.  Skin:  Skin is warm, dry and intact.  Psychiatric: Mood and affect are normal.   ____________________________________________   LABS (all labs ordered are listed, but only abnormal results are displayed)  Labs Reviewed  COMPREHENSIVE METABOLIC PANEL - Abnormal; Notable for the following:    Glucose, Bld 126 (*)    Creatinine, Ser 1.07 (*)    Albumin 2.9 (*)    GFR calc non Af Amer 44 (*)    GFR calc Af Amer 51 (*)    All other components within normal limits  TROPONIN I - Abnormal; Notable for the following:    Troponin I 0.05 (*)     All other components within normal limits  URINALYSIS COMPLETEWITH MICROSCOPIC ([facility] ONLY) -  Abnormal; Notable for the following:    Color, Urine YELLOW (*)    APPearance CLEAR (*)    Leukocytes, UA 1+ (*)    Bacteria, UA RARE (*)    Squamous Epithelial / LPF 0-5 (*)    All other components within normal limits  TROPONIN I - Abnormal; Notable for the following:    Troponin I 0.07 (*)    All other components within normal limits  BRAIN NATRIURETIC PEPTIDE - Abnormal; Notable for the following:    B Natriuretic Peptide 208.0 (*)    All other components within normal limits  CBC  MAGNESIUM  TSH   ____________________________________________  EKG  ED ECG REPORT I, Rebecka Apley, the attending physician, personally viewed and interpreted this ECG.   Date: 12/20/2014  EKG Time: 2236  Rate: 55  Rhythm: sinus bradycardia  Axis: normal  Intervals:none  ST&T Change: none   ED ECG REPORT #2 I, Rebecka Apley, the attending physician, personally viewed and interpreted this ECG.   Date: 12/20/2014  EKG Time: 0031  Rate: 128  Rhythm: atrial fibrillation, rate 128  Axis: normal  Intervals:none  ST&T Change: st depression in lead II, V4,V5 and V6   ____________________________________________  RADIOLOGY  Chest x-ray: No pulmonary edema, ill-defined opacity at the left lung base, appears minimally increased from prior exam likely related to atelectasis, small developing pleural effusion not excluded  CT head: No acute finding, extensive remote ischemic injury including the right occipital cortex, known right MCA bifurcation aneurysm, left sphenoid sinus effusion ____________________________________________   PROCEDURES  Procedure(s) performed: None  Critical Care performed: Yes, see critical care note(s)   CRITICAL CARE Performed by: Lucrezia Europe P   Total critical care time:  Critical care time was exclusive of separately billable  procedures and treating other patients.  Critical care was necessary to treat or prevent imminent or life-threatening deterioration.  Critical care was time spent personally by me on the following activities: development of treatment plan with patient and/or surrogate as well as nursing, discussions with consultants, evaluation of patient's response to treatment, examination of patient, obtaining history from patient or surrogate, ordering and performing treatments and interventions, ordering and review of laboratory studies, ordering and review of radiographic studies, pulse oximetry and re-evaluation of patient's condition.   ____________________________________________   INITIAL IMPRESSION / ASSESSMENT AND PLAN / ED COURSE  Pertinent labs & imaging results that were available during my care of the patient were reviewed by me and considered in my medical decision making (see chart for details).  This is a 81 year old female who was brought in today by her daughter after having what appeared to be convulsions and a staring episode at home. The patient does not have any acute hemorrhage on her CT scan but does have an ill-defined opacity on her chest x-ray. While the patient was in the emergency department she went into atrial fibrillation with rapid ventricular rate. I gave her a dose of diltiazem 10 mg IV 1 dose and the patient's A. fib improved but her blood pressure dropped. I gave her 500 ML's of normal saline and her blood pressure improved. The patient did convert back into sinus rhythm but given her new onset A. fib with RVR as well as her hypotension I felt that the patient should be admitted to the hospital. I discussed the case with the hospitalist and they did agree with the plan. The patient will be admitted to the hospitalist service to evaluate  her A. fib as well as her tremors. ____________________________________________   FINAL CLINICAL IMPRESSION(S) / ED DIAGNOSES  Final  diagnoses:  Convulsions, unspecified convulsion type  Atrial fibrillation with RVR      Rebecka Apley, MD 12/20/14 [phone]

## 2014-12-20 NOTE — Progress Notes (Signed)
[facility] - [place] at [facility]   PATIENT NAME: Alexis Boyle    MR#:  [id number]  DATE OF BIRTH:  [birth year]  SUBJECTIVE:  CHIEF COMPLAINT:   Chief Complaint  Patient presents with  . Tremors   - mouth breather, pleasantly confused - daughter at bedside. Denies any complaints for now - admitted for pneumonia  REVIEW OF SYSTEMS:  Review of Systems  Constitutional: Negative for fever and chills.  HENT: Positive for hearing loss. Negative for congestion and nosebleeds.   Eyes: Positive for blurred vision.       Macular degeneration  Respiratory: Negative for cough, shortness of breath and wheezing.   Cardiovascular: Negative for chest pain and palpitations.  Gastrointestinal: Negative for nausea, vomiting, abdominal pain, diarrhea and constipation.  Genitourinary: Negative for dysuria.  Neurological: Positive for weakness. Negative for dizziness, tingling, tremors, seizures and headaches.    DRUG ALLERGIES:   Allergies  Allergen Reactions  . Sulfa Antibiotics Other (See Comments)    Reaction: unknown    VITALS:  Blood pressure 113/47, pulse 67, temperature 98.3 F (36.8 C), temperature source Oral, resp. rate 18, height  (1.473 m), weight 47.628 kg (105 lb), SpO2 93 %.  PHYSICAL EXAMINATION:  Physical Exam  GENERAL:  81 y.o.-year-old frail appearing patient lying in the bed with no acute distress.  EYES: Pupils equal, round, reactive to light and accommodation. No scleral icterus. Extraocular muscles intact.  HEENT: Head atraumatic, normocephalic. Oropharynx and nasopharynx clear. Dry mucous membranes NECK:  Supple, no jugular venous distention. No thyroid enlargement, no tenderness.  LUNGS: Normal breath sounds bilaterally, no wheezing, rales,rhonchi or crepitation. No use of accessory muscles of respiration. Decreased left basilar breath sounds CARDIOVASCULAR: S1, S2 normal. No rubs, or gallops. 3/6 systolic murmur is  present ABDOMEN: Soft, nontender, nondistended. Bowel sounds present. No organomegaly or mass.  EXTREMITIES: No pedal edema, cyanosis, or clubbing. Old bruises noted on left knee and left arm NEUROLOGIC: Cranial nerves II through XII are intact. Muscle strength 5/5 in all extremities. Sensation intact. Gait not checked.  PSYCHIATRIC: The patient is alert and oriented x 1-2, at her baseline  SKIN: No obvious rash, lesion, or ulcer.    LABORATORY PANEL:   CBC  Recent Labs Lab 12/19/14 2352  WBC 10.2  HGB 13.8  HCT 41.4  PLT 259   ------------------------------------------------------------------------------------------------------------------  Chemistries   Recent Labs Lab 12/19/14 2352 12/20/14 0304  NA 140  --   K 3.6  --   CL 101  --   CO2 30  --   GLUCOSE 126*  --   BUN 20  --   CREATININE 1.07*  --   CALCIUM 9.1  --   MG  --  1.9  AST 23  --   ALT 15  --   ALKPHOS 65  --   BILITOT 0.8  --    ------------------------------------------------------------------------------------------------------------------  Cardiac Enzymes  Recent Labs Lab 12/20/14 0304  TROPONINI 0.07*   ------------------------------------------------------------------------------------------------------------------  RADIOLOGY:  Ct Head Wo Contrast  12/20/2014   CLINICAL DATA:  Seizure-like episode.  Shaking and weakness.  EXAM: CT HEAD WITHOUT CONTRAST  TECHNIQUE: Contiguous axial images were obtained from the base of the skull through the vertex without intravenous contrast.  COMPARISON:  10/23/2014  FINDINGS: Skull and Sinuses:No notable swelling.  No evidence of fracture.  Left sphenoid sinus effusion.  Orbits: No traumatic finding.  Bilateral cataract resection.  Brain: No evidence of acute infarction, hemorrhage, hydrocephalus, or mass lesion/mass effect.  Extensive chronic small vessel disease with multiple remote lacunar infarcts, most discrete in the right thalamus and left caudate  head. Remote cortical and subcortical infarct in the parasagittal right occipital lobe.  Visible right MCA trifurcation aneurysm. No subarachnoid hemorrhage or brain edema.  IMPRESSION: 1. No acute finding. 2. Extensive remote ischemic injury, including the right occipital cortex. 3. Known right MCA bifurcation aneurysm. 4. Left sphenoid sinus effusion.   Electronically Signed   By: Marnee Spring M.D.   On: 12/20/2014 00:30   Dg Chest Portable 1 View  12/20/2014   CLINICAL DATA:  Tremors. Evaluate pulmonary edema. History of aortic stenosis.  EXAM: PORTABLE CHEST 1 VIEW  COMPARISON:  10/23/2014  FINDINGS: Cardiomediastinal contours are unchanged, unchanged borderline cardiomegaly. No pulmonary edema. Persistent ill-defined left lung base opacity with blunting of the costophrenic angle. Right lung is clear. Left apical scarring is again seen. No pneumothorax. The lungs remain hyperinflated. No acute osseous abnormalities are seen.  IMPRESSION: 1. No pulmonary edema. 2. Ill-defined opacity at the left lung base, appears minimally increased from prior exam. Likely related to atelectasis, small developing pleural effusion is not excluded.   Electronically Signed   By: Rubye Oaks M.D.   On: 12/20/2014 03:31    EKG:   Orders placed or performed during the hospital encounter of 12/19/14  . EKG 12-Lead  . EKG 12-Lead  . ED EKG  . ED EKG  . EKG 12-Lead  . EKG 12-Lead    ASSESSMENT AND PLAN:   81 year old elderly female with past medical history significant for hypertension, aortic stenosis, dementia brought in from home secondary to weakness and noted to have A. fib with RVR.  #1 atrial fibrillation with rapid ventricular response-likely triggered by her infection. -Converted back to normal sinus rhythm. Cardiology consult pending. -Echocardiogram is pending. -Rate is controlled at this time. -Patient is unsteady on her feet and just had 2 falls in the last week. And due to her age she would  not be a good candidate for anticoagulation. Discussed with daughter and will continue only on aspirin  #2 left lower lobe pneumonia-follow-up blood cultures -Continue Levaquin for now.  #3 hyperlipidemia-continue pravastatin  #4 Hypertension-blood pressure was low on admission, so will hold off on starting her medications -Likely from her tachycardia yesterday. -Troponins are stable. Not acute MI. Patient denies any chest pain -Echocardiogram is pending  #5 DVT prophylaxis-subcutaneous heparin.  Physical therapy is consulted. Daughter interested in taking patient home with home health.  All the records are reviewed and case discussed with Care Management/Social Workerr. Management plans discussed with the patient, family and they are in agreement.  CODE STATUS: DNR  TOTAL TIME TAKING CARE OF THIS PATIENT: 38 minutes.   POSSIBLE D/C IN 1-2 DAYS, DEPENDING ON CLINICAL CONDITION.   Enid Baas M.D on 12/20/2014 at 4:10 PM  Between 7am to 6pm - Pager - [phone]  After 6pm go to [email or web address] - password [email or web address] [facility]  [facility] [place] [facility]  Office  [phone]  CC: Primary care physician; Lauro Regulus., MD

## 2014-12-20 NOTE — Progress Notes (Signed)
ANTIBIOTIC CONSULT NOTE - INITIAL   Pharmacy Consult for Levaquin dosing Indication: CAP  Allergies  Allergen Reactions  . Sulfa Antibiotics Other (See Comments)    Reaction: unknown    Patient Measurements: Weight: 105 lb (47.628 kg) Adjusted Body Weight: n/a  Vital Signs: Temp: 97.7 F (36.5 C) (09/26 0613) Temp Source: Oral (09/26 1610) BP: 130/39 mmHg (09/26 9604) Pulse Rate: 59 (09/26 0613) Intake/Output from previous day:   Intake/Output from this shift:    Labs:  Recent Labs  12/19/14 2352  WBC 10.2  HGB 13.8  PLT 259  CREATININE 1.07*   Estimated Creatinine Clearance: 25.2 mL/min (by C-G formula based on Cr of 1.07). No results for input(s): VANCOTROUGH, VANCOPEAK, VANCORANDOM, GENTTROUGH, GENTPEAK, GENTRANDOM, TOBRATROUGH, TOBRAPEAK, TOBRARND, AMIKACINPEAK, AMIKACINTROU, AMIKACIN in the last 72 hours.   Microbiology: No results found for this or any previous visit (from the past 720 hour(s)).  Medical History: Past Medical History  Diagnosis Date  . Hypertension   . Aortic stenosis   . OSA (obstructive sleep apnea)   . Hyperlipidemia   . Osteoarthritis   . Dementia   . Osteoporosis   . AAA (abdominal aortic aneurysm)   . Pulmonary hypertension   . Depressive disorder     Medications:   Assessment: CXR: L base opacity.  Goal of Therapy:  Resolve infection  Plan:  Levaquin 750 mg IV q 48 hours ordered.  McBane,Matthew S 12/20/2014,6:38 AM

## 2014-12-20 NOTE — Progress Notes (Signed)
   12/20/14 1300  Clinical Encounter Type  Visited With Patient  Visit Type Initial  Referral From Nurse  Consult/Referral To Chaplain  Chaplain visited with patient for prayer but family was in room. Chaplain will come back later.   Frederica Kuster [phone]

## 2014-12-20 NOTE — Progress Notes (Signed)
PT Cancellation Note  Patient Details Name: Alexis Boyle MRN: [id number] DOB: 1919-11-04   Cancelled Treatment:    Reason Eval/Treat Not Completed: Other (comment) (Pt pending cardiology consult; troponin uptrending.)  Will hold PT at this time and re-attempt PT eval at a later date/time as medically appropriate (Dr. Nemiah Commander notified).   Hendricks Limes 12/20/2014, 9:45 AM Hendricks Limes, PT [phone]

## 2014-12-20 NOTE — Progress Notes (Signed)
Dr. Cherlynn Kaiser ordered cpap for hs.

## 2014-12-20 NOTE — Progress Notes (Signed)
*  PRELIMINARY RESULTS* Echocardiogram 2D Echocardiogram has been performed.  Alexis Boyle 12/20/2014, 6:19 PM

## 2014-12-20 NOTE — Care Management Note (Signed)
Case Management Note  Patient Details  Name: Alexis Boyle MRN: [id number] Date of Birth: April 18, 1919  Subjective/Objective:                 Patient  Admitted from home with CAP.  Patient lives at home with daughter and son in Social worker.  Daughter states that patient obtains her medication from [employer] in [place].  Daughter states that patient has walker, transport WC, shower chair, and commode handles in the home.  Patient wears CPAP at night.  Daughter states that if home health care is required at time of discharge she would like to use [facility].    Action/Plan: RNCM to follow for discharge planning   Expected Discharge Date:                  Expected Discharge Plan:     In-House Referral:     Discharge planning Services     Post Acute Care Choice:    Choice offered to:     DME Arranged:    DME Agency:     HH Arranged:    HH Agency:     Status of Service:     Medicare Important Message Given:    Date Medicare IM Given:    Medicare IM give by:    Date Additional Medicare IM Given:    Additional Medicare Important Message give by:     If discussed at Long Length of Stay Meetings, dates discussed:    Additional Comments:  Chapman Fitch, RN 12/20/2014, 11:27 AM

## 2014-12-20 NOTE — H&P (Addendum)
Alexis Boyle is an 81 y.o. female.   Chief Complaint: Weakness HPI: The patient presents emergency department after an episode of tremors and weakness. The patient's daughter states that she been sitting at the dinner table eating soup when she turned pale dropped her hand into her soup and appeared to shake in her lower extremities. The patient never lost consciousness. She denies chest pain or shortness of breath. In the emergency department the patient was found to have a hazy opacity in her left lung as well some white blood cells in her urine. She was recently treated for urinary tract infection and completed antibiotics 4 days ago. Notably, on telemetry the patient had a short period of A. fib with RVR. It resolved with diltiazem which time she went back to sinus rhythm with a rate in the 60s. Her daughter reports no knowledge of atrial fibrillation. Due to new onset of paroxysmal atrial fibrillation with rapid ventricular rate the emergency department staff called for admission.  Past Medical History  Diagnosis Date  . Hypertension   . Aortic stenosis   . OSA (obstructive sleep apnea)   . Hyperlipidemia   . Osteoarthritis   . Dementia   . Osteoporosis   . AAA (abdominal aortic aneurysm)   . Pulmonary hypertension   . Depressive disorder     Past Surgical History  Procedure Laterality Date  . Appendectomy    . Cataract extraction    . Hernia repair      ventral + inguinal    Family History  Problem Relation Age of Onset  . CAD Mother    Social History:  has no tobacco, alcohol, and drug history on file.  Allergies:  Allergies  Allergen Reactions  . Sulfa Antibiotics Other (See Comments)    Reaction: unknown    Prior to Admission medications   Medication Sig Start Date End Date Taking? Authorizing Provider  aspirin EC 81 MG tablet Take 81 mg by mouth at bedtime.    Yes Historical Provider, MD  cholecalciferol (VITAMIN D) 1000 UNITS tablet Take 2,000 Units by mouth  at bedtime.    Yes Historical Provider, MD  citalopram (CELEXA) 10 MG tablet Take 10 mg by mouth at bedtime.    Yes Historical Provider, MD  docusate sodium (COLACE) 100 MG capsule Take 100 mg by mouth 2 (two) times daily.   Yes Historical Provider, MD  fluticasone (FLONASE) 50 MCG/ACT nasal spray Place 2 sprays into both nostrils daily.   Yes Historical Provider, MD  ipratropium (ATROVENT) 0.03 % nasal spray Place 2 sprays into both nostrils at bedtime.   Yes Historical Provider, MD  Multiple Vitamin (MULTIVITAMIN WITH MINERALS) TABS tablet Take 1 tablet by mouth daily.   Yes Historical Provider, MD  Multiple Vitamins-Minerals (PRESERVISION AREDS 2 PO) Take 1 tablet by mouth 2 (two) times daily.    Yes Historical Provider, MD  Omega 3 1000 MG CAPS Take 1,000 mg by mouth 2 (two) times daily.   Yes Historical Provider, MD  omeprazole (PRILOSEC) 20 MG capsule Take 20 mg by mouth daily.   Yes Historical Provider, MD  pravastatin (PRAVACHOL) 40 MG tablet Take 40 mg by mouth at bedtime.    Yes Historical Provider, MD  torsemide (DEMADEX) 10 MG tablet Take 10 mg by mouth daily.    Yes Historical Provider, MD  vitamin B-12 (CYANOCOBALAMIN) 1000 MCG tablet Take 1,000 mcg by mouth daily.   Yes Historical Provider, MD  vitamin E (VITAMIN E) 400 UNIT capsule Take 400  Units by mouth daily.   Yes Historical Provider, MD  cephALEXin (KEFLEX) 500 MG capsule Take 1 capsule (500 mg total) by mouth 3 (three) times daily. Patient not taking: Reported on 12/20/2014 10/23/14   Carrie Mew, MD     Results for orders placed or performed during the hospital encounter of 12/19/14 (from the past 48 hour(s))  Urinalysis complete, with microscopic [facility] only)     Status: Abnormal   Collection Time: 12/19/14 11:51 PM  Result Value Ref Range   Color, Urine YELLOW (A) YELLOW   APPearance CLEAR (A) CLEAR   Glucose, UA NEGATIVE NEGATIVE mg/dL   Bilirubin Urine NEGATIVE NEGATIVE   Ketones, ur NEGATIVE NEGATIVE mg/dL    Specific Gravity, Urine 1.008 1.005 - 1.030   Hgb urine dipstick NEGATIVE NEGATIVE   pH 5.0 5.0 - 8.0   Protein, ur NEGATIVE NEGATIVE mg/dL   Nitrite NEGATIVE NEGATIVE   Leukocytes, UA 1+ (A) NEGATIVE   RBC / HPF 0-5 0 - 5 RBC/hpf   WBC, UA 6-30 0 - 5 WBC/hpf   Bacteria, UA RARE (A) NONE SEEN   Squamous Epithelial / LPF 0-5 (A) NONE SEEN   Mucous PRESENT    Hyaline Casts, UA PRESENT   Brain natriuretic peptide     Status: Abnormal   Collection Time: 12/19/14 11:51 PM  Result Value Ref Range   B Natriuretic Peptide 208.0 (H) 0.0 - 100.0 pg/mL  CBC     Status: None   Collection Time: 12/19/14 11:52 PM  Result Value Ref Range   WBC 10.2 3.6 - 11.0 K/uL   RBC 4.45 3.80 - 5.20 MIL/uL   Hemoglobin 13.8 12.0 - 16.0 g/dL   HCT 41.4 35.0 - 47.0 %   MCV 93.1 80.0 - 100.0 fL   MCH 31.0 26.0 - 34.0 pg   MCHC 33.3 32.0 - 36.0 g/dL   RDW 13.9 11.5 - 14.5 %   Platelets 259 150 - 440 K/uL  Comprehensive metabolic panel     Status: Abnormal   Collection Time: 12/19/14 11:52 PM  Result Value Ref Range   Sodium 140 135 - 145 mmol/L   Potassium 3.6 3.5 - 5.1 mmol/L   Chloride 101 101 - 111 mmol/L   CO2 30 22 - 32 mmol/L   Glucose, Bld 126 (H) 65 - 99 mg/dL   BUN 20 6 - 20 mg/dL   Creatinine, Ser 1.07 (H) 0.44 - 1.00 mg/dL   Calcium 9.1 8.9 - 10.3 mg/dL   Total Protein 6.6 6.5 - 8.1 g/dL   Albumin 2.9 (L) 3.5 - 5.0 g/dL   AST 23 15 - 41 U/L   ALT 15 14 - 54 U/L   Alkaline Phosphatase 65 38 - 126 U/L   Total Bilirubin 0.8 0.3 - 1.2 mg/dL   GFR calc non Af Amer 44 (L) >60 mL/min   GFR calc Af Amer 51 (L) >60 mL/min    Comment: (NOTE) The eGFR has been calculated using the CKD EPI equation. This calculation has not been validated in all clinical situations. eGFR's persistently <60 mL/min signify possible Chronic Kidney Disease.    Anion gap 9 5 - 15  Troponin I     Status: Abnormal   Collection Time: 12/19/14 11:52 PM  Result Value Ref Range   Troponin I 0.05 (H) <0.031 ng/mL     Comment: READ BACK AND VERIFIED WITH BRAD YEATTS AT 0028 12/20/14.PMH        PERSISTENTLY INCREASED TROPONIN VALUES IN THE RANGE  OF 0.04-0.49 ng/mL CAN BE SEEN IN:       -UNSTABLE ANGINA       -CONGESTIVE HEART FAILURE       -MYOCARDITIS       -CHEST TRAUMA       -ARRYHTHMIAS       -LATE PRESENTING MYOCARDIAL INFARCTION       -COPD   CLINICAL FOLLOW-UP RECOMMENDED.   Troponin I     Status: Abnormal   Collection Time: 12/20/14  3:04 AM  Result Value Ref Range   Troponin I 0.07 (H) <0.031 ng/mL    Comment: RESULTS PREVIOUSLY CALLED AT 0028 12/20/14.PMH        PERSISTENTLY INCREASED TROPONIN VALUES IN THE RANGE OF 0.04-0.49 ng/mL CAN BE SEEN IN:       -UNSTABLE ANGINA       -CONGESTIVE HEART FAILURE       -MYOCARDITIS       -CHEST TRAUMA       -ARRYHTHMIAS       -LATE PRESENTING MYOCARDIAL INFARCTION       -COPD   CLINICAL FOLLOW-UP RECOMMENDED.   Magnesium     Status: None   Collection Time: 12/20/14  3:04 AM  Result Value Ref Range   Magnesium 1.9 1.7 - 2.4 mg/dL  TSH     Status: None   Collection Time: 12/20/14  3:04 AM  Result Value Ref Range   TSH 0.896 0.350 - 4.500 uIU/mL   Ct Head Wo Contrast  12/20/2014   CLINICAL DATA:  Seizure-like episode.  Shaking and weakness.  EXAM: CT HEAD WITHOUT CONTRAST  TECHNIQUE: Contiguous axial images were obtained from the base of the skull through the vertex without intravenous contrast.  COMPARISON:  10/23/2014  FINDINGS: Skull and Sinuses:No notable swelling.  No evidence of fracture.  Left sphenoid sinus effusion.  Orbits: No traumatic finding.  Bilateral cataract resection.  Brain: No evidence of acute infarction, hemorrhage, hydrocephalus, or mass lesion/mass effect.  Extensive chronic small vessel disease with multiple remote lacunar infarcts, most discrete in the right thalamus and left caudate head. Remote cortical and subcortical infarct in the parasagittal right occipital lobe.  Visible right MCA trifurcation aneurysm. No  subarachnoid hemorrhage or brain edema.  IMPRESSION: 1. No acute finding. 2. Extensive remote ischemic injury, including the right occipital cortex. 3. Known right MCA bifurcation aneurysm. 4. Left sphenoid sinus effusion.   Electronically Signed   By: Monte Fantasia M.D.   On: 12/20/2014 00:30   Dg Chest Portable 1 View  12/20/2014   CLINICAL DATA:  Tremors. Evaluate pulmonary edema. History of aortic stenosis.  EXAM: PORTABLE CHEST 1 VIEW  COMPARISON:  10/23/2014  FINDINGS: Cardiomediastinal contours are unchanged, unchanged borderline cardiomegaly. No pulmonary edema. Persistent ill-defined left lung base opacity with blunting of the costophrenic angle. Right lung is clear. Left apical scarring is again seen. No pneumothorax. The lungs remain hyperinflated. No acute osseous abnormalities are seen.  IMPRESSION: 1. No pulmonary edema. 2. Ill-defined opacity at the left lung base, appears minimally increased from prior exam. Likely related to atelectasis, small developing pleural effusion is not excluded.   Electronically Signed   By: Jeb Levering M.D.   On: 12/20/2014 03:31    Review of Systems  Constitutional: Negative for fever and chills.  HENT: Negative for sore throat and tinnitus.   Eyes: Negative for blurred vision and redness.  Respiratory: Negative for cough and shortness of breath.   Cardiovascular: Negative for chest pain, palpitations, orthopnea and PND.  Gastrointestinal: Negative for nausea, vomiting, abdominal pain and diarrhea.  Genitourinary: Negative for dysuria, urgency and frequency.  Musculoskeletal: Negative for myalgias and joint pain.  Skin: Negative for rash.       No lesions  Neurological: Positive for weakness. Negative for speech change and focal weakness.  Endo/Heme/Allergies: Does not bruise/bleed easily.       No temperature intolerance  Psychiatric/Behavioral: Negative for depression and suicidal ideas.    Blood pressure 121/54, pulse 61, temperature 97.6  F (36.4 C), temperature source Oral, resp. rate 23, SpO2 90 %. Physical Exam  Nursing note and vitals reviewed. Constitutional: She is oriented to person, place, and time. She appears well-developed and well-nourished. No distress.  HENT:  Head: Normocephalic and atraumatic.  Mouth/Throat: Oropharynx is clear and moist.  Eyes: Conjunctivae and EOM are normal. Pupils are equal, round, and reactive to light.  Neck: Normal range of motion. Neck supple. No JVD present. No tracheal deviation present. No thyromegaly present.  Cardiovascular: Normal rate, regular rhythm and normal heart sounds.  Exam reveals no gallop and no friction rub.   No murmur heard. Respiratory: Effort normal and breath sounds normal.  GI: Soft. Bowel sounds are normal. She exhibits no distension. There is no tenderness.  Genitourinary:  Deferred  Musculoskeletal: Normal range of motion. She exhibits no edema.  Lymphadenopathy:    She has no cervical adenopathy.  Neurological: She is alert and oriented to person, place, and time. No cranial nerve deficit. She exhibits normal muscle tone.  Skin: Skin is warm and dry.  Psychiatric: She has a normal mood and affect. Her behavior is normal. Judgment and thought content normal.     Assessment/Plan This is a 81 year old Caucasian female admitted for community acquired pneumonia, paroxysmal atrial fibrillation and weakness. 1. Community-acquired pneumonia: Patient has normal oxygen saturations on room air but is occasionally tachypneic. She is a mouth breather and occasionally clears her throat but has clear lungs. She has been weak for a few weeks at this point due to urinary tract infection. The hazy opacity on chest x-ray may represent atelectasis but it is also unknown if the patient's pyuria is sterile or an incompletely treated urinary tract infection. Thus we will treat with antibiotics. The patient does not meet septic criteria. Pneumonia severity index is 112. 2.  Atrial fibrillation with rapid ventricular rate: New onset; paroxysmal. Likely secondary to inflammation due to pneumonia. We will continue to monitor telemetry. I have ordered a cardiology consult for recommendations regarding anticoagulation. Risk-benefit analysis likely leans toward aspirin only CHADS2 score indicates anticoagulation. 3. Weakness: Likely sequelae of UTI. Check TSH. Unlikely any seizure activity. Physical therapy evaluation placed. 4. DVT prophylaxis: Heparin 5. GI prophylaxis: Pantoprazole due to history of gastritis The patient is a DO NOT RESUSCITATE. Time spent on admission orders and patient care approximately 35 minutes.  Harrie Foreman 12/20/2014, 5:34 AM

## 2014-12-20 NOTE — Clinical Social Work Note (Addendum)
Clinical Social Work Assessment  Patient Details  Name: Alexis Boyle MRN: [id number] Date of Birth: 1919-08-18  Date of referral:  12/20/14               Reason for consult:  Facility Placement, Other (Comment Required) (ALF Placement )                Permission sought to share information with:  Case Manager Permission granted to share information::  Yes, Verbal Permission Granted  Name::      RN Case Land::     Relationship::     Contact Information:     Housing/Transportation Living arrangements for the past 2 months:  Single Family Home Source of Information:  Patient, Adult Children Patient Interpreter Needed:  None Criminal Activity/Legal Involvement Pertinent to Current Situation/Hospitalization:  No - Comment as needed Significant Relationships:  Adult Children Lives with:  Adult Children Do you feel safe going back to the place where you live?  Yes Need for family participation in patient care:  Yes (Comment)  Care giving concerns:  Patient lives with her daughter Alexis Boyle [phone] in [facility].    Social Worker assessment / plan:  Holiday representative (CSW) received consult for ALF placement. CSW met with patient and her daughter Alexis Boyle was at bedside. CSW introduced self and explained role of CSW department. Patient was alert and oriented and sitting in the bed. Patient reported that she is from [place] and moved to [place] 5 years ago to live with her daughter Alexis Boyle. Per Alexis Boyle patient's oldest daughter Alexis Boyle is patient's HPOA and lives in [place]. CSW explained to patient and daughter that she is under Medicare observation, which means that SNF will not be covered. CSW discussed long term care and private pay options for SNF and ALF. CSW made daughter and patient aware that SNF is around $7,000 to $8,000 per month and ALF is $3,000 to $4,000 per month. Daughter reported that patient would not qualify for Medicaid. Per daughter she helps patient to the  bathroom and assist with her ADL's. Daughter reported that patient has been using a wheel chair around the house the past couple of weeks because she has fallen a few times. Daughter reported that she had a UTI a few weeks ago and has been going down hill.   Daughter and patient are not interested in ALF or SNF at this time. Daughter requested [facility] services with [place]. RN Case Manager aware of above. Daughter also asked CSW about DNR and a living will. CSW explained the difference the two. Daughter requested patient be a DNR. Patient is listed as a DNR in EPIC. Daughter reported that patient completed those documents while in [place]. Please reconsult if future social work needs arise. CSW signing off.    Employment status:  Disabled (Comment on whether or not currently receiving Disability), Retired Forensic scientist:  Medicare PT Recommendations:  Not assessed at this time Information / Referral to community resources:  Other (Comment Required) (Case Manager [facility]) )  Patient/Family's Response to care: Patient and daughter are agreeable to going home with home health with [facility].   Patient/Family's Understanding of and Emotional Response to Diagnosis, Current Treatment, and Prognosis: Patient and daughter were pleasant throughout assessment and thanked CSW for visit.   Emotional Assessment Appearance:  Appears stated age Attitude/Demeanor/Rapport:    Affect (typically observed):  Accepting, Adaptable, Pleasant Orientation:  Oriented to Self, Oriented to Place, Oriented to  Time, Oriented to Situation Alcohol / Substance use:  Not Applicable Psych involvement (Current and /or in the community):  No (Comment)  Discharge Needs  Concerns to be addressed:  Discharge Planning Concerns Readmission within the last 30 days:  No Current discharge risk:  Chronically ill, Cognitively Impaired Barriers to Discharge:  Continued Medical Work up   Alexis Freshwater,  LCSW 12/20/2014, 1:27 PM

## 2014-12-20 NOTE — ED Notes (Signed)
Diamond MD at bedside. 

## 2014-12-21 DIAGNOSIS — J189 Pneumonia, unspecified organism: Secondary | ICD-10-CM | POA: Diagnosis present

## 2014-12-21 DIAGNOSIS — Z9049 Acquired absence of other specified parts of digestive tract: Secondary | ICD-10-CM | POA: Diagnosis present

## 2014-12-21 DIAGNOSIS — E785 Hyperlipidemia, unspecified: Secondary | ICD-10-CM | POA: Diagnosis present

## 2014-12-21 DIAGNOSIS — Z79899 Other long term (current) drug therapy: Secondary | ICD-10-CM | POA: Diagnosis not present

## 2014-12-21 DIAGNOSIS — M1990 Unspecified osteoarthritis, unspecified site: Secondary | ICD-10-CM | POA: Diagnosis present

## 2014-12-21 DIAGNOSIS — G309 Alzheimer's disease, unspecified: Secondary | ICD-10-CM | POA: Diagnosis present

## 2014-12-21 DIAGNOSIS — Z7901 Long term (current) use of anticoagulants: Secondary | ICD-10-CM | POA: Diagnosis not present

## 2014-12-21 DIAGNOSIS — G4733 Obstructive sleep apnea (adult) (pediatric): Secondary | ICD-10-CM | POA: Diagnosis present

## 2014-12-21 DIAGNOSIS — Z9889 Other specified postprocedural states: Secondary | ICD-10-CM | POA: Diagnosis not present

## 2014-12-21 DIAGNOSIS — Z66 Do not resuscitate: Secondary | ICD-10-CM | POA: Diagnosis present

## 2014-12-21 DIAGNOSIS — I4891 Unspecified atrial fibrillation: Secondary | ICD-10-CM | POA: Diagnosis present

## 2014-12-21 DIAGNOSIS — Z9181 History of falling: Secondary | ICD-10-CM | POA: Diagnosis not present

## 2014-12-21 DIAGNOSIS — F0280 Dementia in other diseases classified elsewhere without behavioral disturbance: Secondary | ICD-10-CM | POA: Diagnosis present

## 2014-12-21 DIAGNOSIS — Z87440 Personal history of urinary (tract) infections: Secondary | ICD-10-CM | POA: Diagnosis not present

## 2014-12-21 DIAGNOSIS — I480 Paroxysmal atrial fibrillation: Secondary | ICD-10-CM | POA: Diagnosis present

## 2014-12-21 DIAGNOSIS — Z882 Allergy status to sulfonamides status: Secondary | ICD-10-CM | POA: Diagnosis not present

## 2014-12-21 DIAGNOSIS — Z961 Presence of intraocular lens: Secondary | ICD-10-CM | POA: Diagnosis present

## 2014-12-21 DIAGNOSIS — I10 Essential (primary) hypertension: Secondary | ICD-10-CM | POA: Diagnosis present

## 2014-12-21 DIAGNOSIS — Z8249 Family history of ischemic heart disease and other diseases of the circulatory system: Secondary | ICD-10-CM | POA: Diagnosis not present

## 2014-12-21 DIAGNOSIS — Z7982 Long term (current) use of aspirin: Secondary | ICD-10-CM | POA: Diagnosis not present

## 2014-12-21 DIAGNOSIS — I350 Nonrheumatic aortic (valve) stenosis: Secondary | ICD-10-CM | POA: Diagnosis present

## 2014-12-21 DIAGNOSIS — Z9849 Cataract extraction status, unspecified eye: Secondary | ICD-10-CM | POA: Diagnosis not present

## 2014-12-21 DIAGNOSIS — M810 Age-related osteoporosis without current pathological fracture: Secondary | ICD-10-CM | POA: Diagnosis present

## 2014-12-21 LAB — BASIC METABOLIC PANEL
Anion gap: 7 (ref 5–15)
BUN: 21 mg/dL — AB (ref 6–20)
CALCIUM: 8.4 mg/dL — AB (ref 8.9–10.3)
CO2: 30 mmol/L (ref 22–32)
CREATININE: 1.16 mg/dL — AB (ref 0.44–1.00)
Chloride: 106 mmol/L (ref 101–111)
GFR calc Af Amer: 46 mL/min — ABNORMAL LOW (ref >60)
GFR, EST NON AFRICAN AMERICAN: 40 mL/min — AB (ref >60)
GLUCOSE: 105 mg/dL — AB (ref 65–99)
POTASSIUM: 3.4 mmol/L — AB (ref 3.5–5.1)
SODIUM: 143 mmol/L (ref 135–145)

## 2014-12-21 MED ORDER — POTASSIUM CHLORIDE CRYS ER 20 MEQ PO TBCR
40.0000 meq | EXTENDED_RELEASE_TABLET | Freq: Once | ORAL | Status: DC
  Filled 2014-12-21: qty 2

## 2014-12-21 MED ORDER — LEVOFLOXACIN 500 MG PO TABS: 500.0000 mg | ORAL_TABLET | Freq: Every day | ORAL | Status: DC

## 2014-12-21 NOTE — Progress Notes (Signed)
Clinical Social Worker (CSW) met with patient's daughter Santiago Glad at bedside to discuss D/C plan. Patient was changed to inpatient. CSW discussed private pay for rehab and inpatient under medicare. Daughter reported that she has discussed the plan with her husband and they have decided to take her home today. Per daughter she is concerned that patient may go down hill with a decline in mental status if she goes to a Passenger transport manager. CSW provided SNF list to daughter. RN and MD are aware of above. Please reconsult if future social work needs arise. CSW signing off.   Blima Rich, [place] [phone]

## 2014-12-21 NOTE — Care Management Note (Signed)
Case Management Note  Patient Details  Name: Jaymes Revels MRN: [id number] Date of Birth: Jun 08, 1919  Subjective/Objective:    Referral to [facility] for home health nursing, PT and HHA. Spoke with daughter Clydie Braun and she is agreeable to POC. Chaplain requested to assist with advanced directives.                  Action/Plan: Home health  Expected Discharge Date:                  Expected Discharge Plan:  Home w Home Health Services  In-House Referral:     Discharge planning Services  CM Consult  Post Acute Care Choice:  Home Health Choice offered to:  Adult Children  DME Arranged:    DME Agency:  [facility]  HH Arranged:  PT, RN [facility] Agency:     Status of Service:  Completed, signed off  Medicare Important Message Given:    Date Medicare IM Given:    Medicare IM give by:    Date Additional Medicare IM Given:    Additional Medicare Important Message give by:     If discussed at Long Length of Stay Meetings, dates discussed:    Additional Comments:  Marily Memos, RN 12/21/2014, 10:32 AM

## 2014-12-21 NOTE — Evaluation (Signed)
Physical Therapy Evaluation Patient Details Name: Alexis Boyle MRN: [id number] DOB: 08/11/1919 Today's Date: 12/21/2014   History of Present Illness  Pt is a 81 y.o. female presenting to ED with weakness and tremors.  Pt found to have new onset paroxysmal a-fib with rapid ventricular rate, community acquired PNA, and weakness.  Pt also with recent treatment for UTI.  CT of head negative for acute finding (pt with known R MCA bifurcation aneurysm).  Pt with multiple recent falls.  PMH includes:  OA, dementia, AAA, pulmonary htn.  Clinical Impression  Currently pt demonstrates impairments with strength, balance, activity tolerance, and limitations with functional mobility.  Prior to admission (per pt's daughter), pt sometimes used w/c (gets pushed in transport chair in home and has assist with transfers) or gets assist to stand (d/t chronic posterior lean) but once up pt able to ambulate short distance with 3ww on her own.  Pt lives with her daughter and son-in-law in 1 level home with 5 STE with B railing.  Currently pt is max assist with bed mobility and with 4 trials, only able to obtain 1/2 to 3/4 stand with RW and max assist x2.  Pt would benefit from skilled PT to address above noted impairments and functional limitations.  Recommend pt discharge to STR when medically appropriate.  SW and MD [facility] notified of pt's significant assist levels and recommendation for STR.     Follow Up Recommendations SNF    Equipment Recommendations  3in1 (PT)    Recommendations for Other Services       Precautions / Restrictions Precautions Precautions: Fall Restrictions Weight Bearing Restrictions: No      Mobility  Bed Mobility Overal bed mobility: Needs Assistance Bed Mobility: Supine to Sit;Sit to Supine     Supine to sit: Max assist;HOB elevated Sit to supine: Max assist;HOB elevated   General bed mobility comments: pt requiring vc's and consistent cueing to assist with bed  mobility  Transfers Overall transfer level: Needs assistance Equipment used: Rolling walker (2 wheeled) Transfers: Sit to/from Stand Sit to Stand: Max assist;+2 physical assistance         General transfer comment: 4 trials to stand with max cueing for technique; pt requiring vc's to keep both feet on the floor and for hand and feet placement; pt only able to stand 1/2 to 3/4th stand with all attempts  Ambulation/Gait             General Gait Details: unable to safely at this time  Stairs            Wheelchair Mobility    Modified Rankin (Stroke Patients Only)       Balance Overall balance assessment: Needs assistance Sitting-balance support: Bilateral upper extremity supported;Feet supported Sitting balance-Leahy Scale: Poor   Postural control: Posterior lean Standing balance support: Bilateral upper extremity supported (on RW) Standing balance-Leahy Scale: Poor                               Pertinent Vitals/Pain Pain Assessment: No/denies pain  Vitals stable and WFL throughout treatment session.    Home Living Family/patient expects to be discharged to:: Private residence Living Arrangements: Children (Pt's daughter and son-in-law who provide 24/7 assist) Available Help at Discharge: Family;Available 24 hours/day Type of Home: House Home Access: Stairs to enter Entrance Stairs-Rails: Doctor, general practice of Steps: 5 Home Layout: One level Home Equipment: Transport chair;Shower seat (3ww; commode handles; CPAP at  night)      Prior Function Level of Independence: Needs assistance   Gait / Transfers Assistance Needed: sometimes uses w/c (gets pushed in transport chair in home and has assist with transfers) or gets assist to stand (d/t chronic posterior lean) but once up pt able to ambulate short distance with 3ww on her own; does not use AD in bathroom d/t small space  ADL's / Homemaking Assistance Needed: requires  assist  Comments: pt has multiple recent falls (all d/t posterior lean falling backwards except for one in bathroom about 2 weeks ago unwitnessed but pt with bruising on R side d/t fall)     Hand Dominance        Extremity/Trunk Assessment   Upper Extremity Assessment: Generalized weakness           Lower Extremity Assessment: Generalized weakness         Communication   Communication: HOH  Cognition Arousal/Alertness: Lethargic Behavior During Therapy: Anxious Overall Cognitive Status: History of cognitive impairments - at baseline                      General Comments   Nursing cleared pt for participation in physical therapy.  Pt agreeable to PT session.  Pt's daughter present during session.    Exercises        Assessment/Plan    PT Assessment Patient needs continued PT services  PT Diagnosis Difficulty walking;Generalized weakness   PT Problem List Decreased strength;Decreased activity tolerance;Decreased balance;Decreased mobility;Decreased knowledge of use of DME;Decreased knowledge of precautions  PT Treatment Interventions DME instruction;Gait training;Stair training;Functional mobility training;Therapeutic activities;Therapeutic exercise;Balance training;Patient/family education   PT Goals (Current goals can be found in the Care Plan section) Acute Rehab PT Goals Patient Stated Goal: to be able to walk again with assist PT Goal Formulation: With family Time For Goal Achievement: 01/04/15 Potential to Achieve Goals: Fair    Frequency Min 2X/week   Barriers to discharge Other (comment) Level of assist    Co-evaluation               End of Session Equipment Utilized During Treatment: Gait belt Activity Tolerance: Patient limited by fatigue;Patient limited by lethargy Patient left: in bed;with call bell/phone within reach;with bed alarm set;with family/visitor present;with SCD's reapplied Nurse Communication: Mobility  status;Precautions         Time: 2440-1027 PT Time Calculation (min) (ACUTE ONLY): 32 min   Charges:   PT Evaluation $Initial PT Evaluation Tier I: 1 Procedure PT Treatments $Therapeutic Activity: 8-22 mins   PT G CodesHendricks Limes 01/03/15, 11:28 AM Hendricks Limes, PT [phone]

## 2014-12-21 NOTE — Progress Notes (Signed)
Physical therapy recommended to rehabilitation for patient. Discussed with patient and also family for long time about going to rehabilitation. All the information provided, meeting with social worker done. At the end family decided to take the patient home with home health.

## 2014-12-21 NOTE — Discharge Planning (Signed)
Pt is going to go home with daughter, family was hesitant to take her home but they have decided to take pt home and resume her care with home health. IV removed and catheter intact upon removal. Wife's husband will come and assist with taking pt home.

## 2014-12-21 NOTE — Discharge Summary (Signed)
[facility] - [place] at [facility]   PATIENT NAME: Alexis Boyle    MR#:  [id number]  DATE OF BIRTH:  1919-09-16  DATE OF ADMISSION:  12/19/2014 ADMITTING PHYSICIAN: Arnaldo Natal, MD  DATE OF DISCHARGE: 12/21/2014  PRIMARY CARE PHYSICIAN: Lauro Regulus., MD    ADMISSION DIAGNOSIS:  Atrial fibrillation with RVR [I48.91] Convulsions, unspecified convulsion type [R56.9]  DISCHARGE DIAGNOSIS:  Active Problems:   CAP (community acquired pneumonia)   SECONDARY DIAGNOSIS:   Past Medical History  Diagnosis Date  . Hypertension   . Aortic stenosis   . OSA (obstructive sleep apnea)   . Hyperlipidemia   . Osteoarthritis   . Dementia   . Osteoporosis   . AAA (abdominal aortic aneurysm)   . Pulmonary hypertension   . Depressive disorder     HOSPITAL COURSE:   81 year old elderly female with past medical history significant for hypertension, aortic stenosis, dementia brought in from home secondary to weakness and noted to have A. fib with RVR.  #1 atrial fibrillation with rapid ventricular response-likely triggered by her infection. -Converted back to normal sinus rhythm. Cardiology consult appreciated - ECHO with normal EF, aortic stenosis noted. -Rate is controlled at this time. Not on rate limiting meds due to her low BP -Patient is unsteady on her feet and just had 2 falls in the last week. And due to her age she would not be a good candidate for anticoagulation. Discussed with daughter and will continue only on aspirin for anticoagulation  #2 left lower lobe pneumonia- No  blood cultures done in ED -Continue Levaquin for now.  #3 hyperlipidemia-continue pravastatin  #4 Hypertension-runs low usually, not on meds at home. Hold torsemide for the next 5 days and use it prn as no lower extremity swelling now  -Troponins are stable. Not acute MI. Patient denies any chest pain -Echocardiogram is normal with no wall motion  abnormalities  #5 OSA- uses CPAP at bedtime, patient is as mouth breather  Will be discharged home today with home health  DISCHARGE CONDITIONS:   Stable  CONSULTS OBTAINED:  Treatment Team:  Dalia Heading, MD Marcina Millard, MD  DRUG ALLERGIES:   Allergies  Allergen Reactions  . Sulfa Antibiotics Other (See Comments)    Reaction: unknown    DISCHARGE MEDICATIONS:   Current Discharge Medication List    START taking these medications   Details  levofloxacin (LEVAQUIN) 500 MG tablet Take 1 tablet (500 mg total) by mouth daily. Qty: 5 tablet, Refills: 0      CONTINUE these medications which have NOT CHANGED   Details  aspirin EC 81 MG tablet Take 81 mg by mouth at bedtime.     cholecalciferol (VITAMIN D) 1000 UNITS tablet Take 2,000 Units by mouth at bedtime.     citalopram (CELEXA) 10 MG tablet Take 10 mg by mouth at bedtime.     docusate sodium (COLACE) 100 MG capsule Take 100 mg by mouth 2 (two) times daily.    fluticasone (FLONASE) 50 MCG/ACT nasal spray Place 2 sprays into both nostrils daily.    ipratropium (ATROVENT) 0.03 % nasal spray Place 2 sprays into both nostrils at bedtime.    Multiple Vitamin (MULTIVITAMIN WITH MINERALS) TABS tablet Take 1 tablet by mouth daily.    Multiple Vitamins-Minerals (PRESERVISION AREDS 2 PO) Take 1 tablet by mouth 2 (two) times daily.     Omega 3 1000 MG CAPS Take 1,000 mg by mouth 2 (two) times daily.  omeprazole (PRILOSEC) 20 MG capsule Take 20 mg by mouth daily.    pravastatin (PRAVACHOL) 40 MG tablet Take 40 mg by mouth at bedtime.     vitamin B-12 (CYANOCOBALAMIN) 1000 MCG tablet Take 1,000 mcg by mouth daily.    vitamin E (VITAMIN E) 400 UNIT capsule Take 400 Units by mouth daily.      STOP taking these medications     torsemide (DEMADEX) 10 MG tablet      cephALEXin (KEFLEX) 500 MG capsule          DISCHARGE INSTRUCTIONS:   1. PCP f/u in 1 week  If you experience worsening of your  admission symptoms, develop shortness of breath, life threatening emergency, suicidal or homicidal thoughts you must seek medical attention immediately by calling 911 or calling your MD immediately  if symptoms less severe.  You Must read complete instructions/literature along with all the possible adverse reactions/side effects for all the Medicines you take and that have been prescribed to you. Take any new Medicines after you have completely understood and accept all the possible adverse reactions/side effects.   Please note  You were cared for by a hospitalist during your hospital stay. If you have any questions about your discharge medications or the care you received while you were in the hospital after you are discharged, you can call the unit and asked to speak with the hospitalist on call if the hospitalist that took care of you is not available. Once you are discharged, your primary care physician will handle any further medical issues. Please note that NO REFILLS for any discharge medications will be authorized once you are discharged, as it is imperative that you return to your primary care physician (or establish a relationship with a primary care physician if you do not have one) for your aftercare needs so that they can reassess your need for medications and monitor your lab values.    Today   CHIEF COMPLAINT:   Chief Complaint  Patient presents with  . Tremors    VITAL SIGNS:  Blood pressure 124/68, pulse 64, temperature 97.4 F (36.3 C), temperature source Axillary, resp. rate 16, height  (1.473 m), weight 49.261 kg (108 lb 9.6 oz), SpO2 97 %.  I/O:   Intake/Output Summary (Last 24 hours) at 12/21/14 1014 Last data filed at 12/21/14 0547  Gross per 24 hour  Intake 2251.25 ml  Output      0 ml  Net 2251.25 ml    PHYSICAL EXAMINATION:   Physical Exam  GENERAL: 81 y.o.-year-old frail appearing patient lying in the bed with no acute distress.  EYES: Pupils  equal, round, reactive to light and accommodation. No scleral icterus. Extraocular muscles intact.  HEENT: Head atraumatic, normocephalic. Oropharynx and nasopharynx clear. Dry mucous membranes NECK: Supple, no jugular venous distention. No thyroid enlargement, no tenderness.  LUNGS: Normal breath sounds bilaterally, no wheezing, rales,rhonchi or crepitation. No use of accessory muscles of respiration. Decreased left basilar breath sounds CARDIOVASCULAR: S1, S2 normal. No rubs, or gallops. 3/6 systolic murmur is present ABDOMEN: Soft, nontender, nondistended. Bowel sounds present. No organomegaly or mass.  EXTREMITIES: No pedal edema, cyanosis, or clubbing. Old bruises noted on left knee and left arm NEUROLOGIC: Cranial nerves II through XII are intact. Muscle strength 5/5 in all extremities. Sensation intact. Gait not checked.  PSYCHIATRIC: The patient is alert and oriented x 1-2, at her baseline  SKIN: No obvious rash, lesion, or ulcer.   DATA REVIEW:   CBC  Recent Labs Lab 12/19/14 2352  WBC 10.2  HGB 13.8  HCT 41.4  PLT 259    Chemistries   Recent Labs Lab 12/19/14 2352 12/20/14 0304 12/21/14 0455  NA 140  --  143  K 3.6  --  3.4*  CL 101  --  106  CO2 30  --  30  GLUCOSE 126*  --  105*  BUN 20  --  21*  CREATININE 1.07*  --  1.16*  CALCIUM 9.1  --  8.4*  MG  --  1.9  --   AST 23  --   --   ALT 15  --   --   ALKPHOS 65  --   --   BILITOT 0.8  --   --     Cardiac Enzymes  Recent Labs Lab 12/20/14 0304  TROPONINI 0.07*    Microbiology Results  Results for orders placed or performed during the hospital encounter of 10/23/14  Urine culture     Status: None   Collection Time: 10/23/14  1:59 AM  Result Value Ref Range Status   Specimen Description URINE, RANDOM  Final   Special Requests NONE  Final   Culture 30,000 COLONIES/mL ENTEROCOCCUS FAECALIS  Final   Report Status 10/25/2014 FINAL  Final   Organism ID, Bacteria ENTEROCOCCUS FAECALIS  Final       Susceptibility   Enterococcus faecalis - MIC*    AMPICILLIN <=2 SENSITIVE Sensitive     LINEZOLID 2 SENSITIVE Sensitive     CIPROFLOXACIN Value in next row Resistant      RESISTANT>=8    LEVOFLOXACIN Value in next row Resistant      RESISTANT>=8    NITROFURANTOIN Value in next row Sensitive      SENSITIVE<=32    TETRACYCLINE Value in next row Resistant      RESISTANT>=16    * 30,000 COLONIES/mL ENTEROCOCCUS FAECALIS    RADIOLOGY:  Ct Head Wo Contrast  12/20/2014   CLINICAL DATA:  Seizure-like episode.  Shaking and weakness.  EXAM: CT HEAD WITHOUT CONTRAST  TECHNIQUE: Contiguous axial images were obtained from the base of the skull through the vertex without intravenous contrast.  COMPARISON:  10/23/2014  FINDINGS: Skull and Sinuses:No notable swelling.  No evidence of fracture.  Left sphenoid sinus effusion.  Orbits: No traumatic finding.  Bilateral cataract resection.  Brain: No evidence of acute infarction, hemorrhage, hydrocephalus, or mass lesion/mass effect.  Extensive chronic small vessel disease with multiple remote lacunar infarcts, most discrete in the right thalamus and left caudate head. Remote cortical and subcortical infarct in the parasagittal right occipital lobe.  Visible right MCA trifurcation aneurysm. No subarachnoid hemorrhage or brain edema.  IMPRESSION: 1. No acute finding. 2. Extensive remote ischemic injury, including the right occipital cortex. 3. Known right MCA bifurcation aneurysm. 4. Left sphenoid sinus effusion.   Electronically Signed   By: Marnee Spring M.D.   On: 12/20/2014 00:30   Dg Chest Portable 1 View  12/20/2014   CLINICAL DATA:  Tremors. Evaluate pulmonary edema. History of aortic stenosis.  EXAM: PORTABLE CHEST 1 VIEW  COMPARISON:  10/23/2014  FINDINGS: Cardiomediastinal contours are unchanged, unchanged borderline cardiomegaly. No pulmonary edema. Persistent ill-defined left lung base opacity with blunting of the costophrenic angle. Right lung is  clear. Left apical scarring is again seen. No pneumothorax. The lungs remain hyperinflated. No acute osseous abnormalities are seen.  IMPRESSION: 1. No pulmonary edema. 2. Ill-defined opacity at the left lung base, appears minimally increased from  prior exam. Likely related to atelectasis, small developing pleural effusion is not excluded.   Electronically Signed   By: Rubye Oaks M.D.   On: 12/20/2014 03:31    EKG:   Orders placed or performed during the hospital encounter of 12/19/14  . EKG 12-Lead  . EKG 12-Lead  . ED EKG  . ED EKG  . EKG 12-Lead  . EKG 12-Lead      Management plans discussed with the patient, family and they are in agreement.  CODE STATUS:     Code Status Orders        Start     Ordered   12/20/14 0609  Do not attempt resuscitation (DNR)   Continuous    Question Answer Comment  In the event of cardiac or respiratory ARREST Do not call a "code blue"   In the event of cardiac or respiratory ARREST Do not perform Intubation, CPR, defibrillation or ACLS   In the event of cardiac or respiratory ARREST Use medication by any route, position, wound care, and other measures to relive pain and suffering. May use oxygen, suction and manual treatment of airway obstruction as needed for comfort.      12/20/14 0608    Advance Directive Documentation        Most Recent Value   Type of Advance Directive  Living will, Healthcare Power of Attorney   Pre-existing out of facility DNR order (yellow form or pink MOST form)     "MOST" Form in Place?        TOTAL TIME TAKING CARE OF THIS PATIENT: 38 minutes.    Enid Baas M.D on 12/21/2014 at 10:14 AM  Between 7am to 6pm - Pager - [phone]  After 6pm go to [email or web address] - password [email or web address] [facility]  [facility]  Office  [phone]  CC: Primary care physician; Lauro Regulus., MD

## 2014-12-21 NOTE — Progress Notes (Addendum)
Pt. Pleasantly confused. No c/o pain. Incontinent of urine. Pills crushed with applesauce. Pt. Receiving IV antibiotics along with IV fluids. Running SR per telemetry monitor. Turned q2h. Bed and bath completed. Will continue to monitor.

## 2014-12-21 NOTE — Progress Notes (Signed)
MD is discharging patient home today. Per PT patient had a hard time standing up. Clinical Education officer, museum (CSW) met with patient's daughter Santiago Glad at bedside. CSW explained that patient is under Medicare observation and Medicare will not pay for rehab. CSW also discussed private pay to rehab with daughter. Per daughter patient will likely not qualify for Medicaid and they do not want to pay privately for rehab. Daughter reported that she would take patient home. RN Case Manager has arranged [place]. CSW provided private duty sitter list to daughter. CSW also gave daughter information for [facility] and Medicaid. Per daughter she will attempt to take patient home today with the help of her husband. CSW completed EMS form in case patient needs EMS transportation home. Daughter is aware that EMS is an option. RN is aware of above. Please reconsult if future social work needs arise. CSW signing off.   Blima Rich, [place] [phone]

## 2014-12-27 ENCOUNTER — Inpatient Hospital Stay
Admission: EM | Admit: 2014-12-27 | Discharge: 2014-12-29 | DRG: 189 | Disposition: A | Discharge status: Home / Self Care | Payer: Medicare Other | Attending: Internal Medicine | Admitting: Internal Medicine

## 2014-12-27 ENCOUNTER — Encounter: Payer: Self-pay | Admitting: Emergency Medicine

## 2014-12-27 ENCOUNTER — Emergency Department: Payer: Medicare Other

## 2014-12-27 ENCOUNTER — Other Ambulatory Visit: Payer: Self-pay

## 2014-12-27 DIAGNOSIS — I959 Hypotension, unspecified: Secondary | ICD-10-CM | POA: Diagnosis present

## 2014-12-27 DIAGNOSIS — I272 Other secondary pulmonary hypertension: Secondary | ICD-10-CM | POA: Diagnosis present

## 2014-12-27 DIAGNOSIS — I482 Chronic atrial fibrillation: Secondary | ICD-10-CM | POA: Diagnosis present

## 2014-12-27 DIAGNOSIS — A419 Sepsis, unspecified organism: Secondary | ICD-10-CM | POA: Diagnosis present

## 2014-12-27 DIAGNOSIS — Z882 Allergy status to sulfonamides status: Secondary | ICD-10-CM | POA: Diagnosis not present

## 2014-12-27 DIAGNOSIS — E785 Hyperlipidemia, unspecified: Secondary | ICD-10-CM | POA: Diagnosis present

## 2014-12-27 DIAGNOSIS — M810 Age-related osteoporosis without current pathological fracture: Secondary | ICD-10-CM | POA: Diagnosis present

## 2014-12-27 DIAGNOSIS — I10 Essential (primary) hypertension: Secondary | ICD-10-CM | POA: Diagnosis present

## 2014-12-27 DIAGNOSIS — M1990 Unspecified osteoarthritis, unspecified site: Secondary | ICD-10-CM | POA: Diagnosis present

## 2014-12-27 DIAGNOSIS — J9601 Acute respiratory failure with hypoxia: Secondary | ICD-10-CM | POA: Diagnosis present

## 2014-12-27 DIAGNOSIS — N390 Urinary tract infection, site not specified: Secondary | ICD-10-CM | POA: Diagnosis present

## 2014-12-27 DIAGNOSIS — F329 Major depressive disorder, single episode, unspecified: Secondary | ICD-10-CM | POA: Diagnosis present

## 2014-12-27 DIAGNOSIS — I350 Nonrheumatic aortic (valve) stenosis: Secondary | ICD-10-CM | POA: Diagnosis present

## 2014-12-27 DIAGNOSIS — G4733 Obstructive sleep apnea (adult) (pediatric): Secondary | ICD-10-CM | POA: Diagnosis present

## 2014-12-27 DIAGNOSIS — R0602 Shortness of breath: Secondary | ICD-10-CM

## 2014-12-27 DIAGNOSIS — J189 Pneumonia, unspecified organism: Secondary | ICD-10-CM | POA: Diagnosis not present

## 2014-12-27 DIAGNOSIS — Z8249 Family history of ischemic heart disease and other diseases of the circulatory system: Secondary | ICD-10-CM

## 2014-12-27 DIAGNOSIS — R0902 Hypoxemia: Secondary | ICD-10-CM

## 2014-12-27 DIAGNOSIS — E119 Type 2 diabetes mellitus without complications: Secondary | ICD-10-CM | POA: Diagnosis present

## 2014-12-27 DIAGNOSIS — Z87891 Personal history of nicotine dependence: Secondary | ICD-10-CM

## 2014-12-27 DIAGNOSIS — F0280 Dementia in other diseases classified elsewhere without behavioral disturbance: Secondary | ICD-10-CM | POA: Diagnosis present

## 2014-12-27 DIAGNOSIS — Z7982 Long term (current) use of aspirin: Secondary | ICD-10-CM

## 2014-12-27 DIAGNOSIS — Z66 Do not resuscitate: Secondary | ICD-10-CM | POA: Diagnosis present

## 2014-12-27 DIAGNOSIS — Y95 Nosocomial condition: Secondary | ICD-10-CM | POA: Diagnosis present

## 2014-12-27 DIAGNOSIS — G309 Alzheimer's disease, unspecified: Secondary | ICD-10-CM | POA: Diagnosis present

## 2014-12-27 LAB — BLOOD GAS, ARTERIAL
Acid-Base Excess: 2.1 mmol/L (ref 0.0–3.0)
Allens test (pass/fail): POSITIVE — AB
BICARBONATE: 28.7 meq/L — AB (ref 21.0–28.0)
Delivery systems: POSITIVE
EXPIRATORY PAP: 5
FIO2: 100
Inspiratory PAP: 16
MECHANICAL RATE: 10
O2 SAT: 100 %
PATIENT TEMPERATURE: 37
PH ART: 7.35 (ref 7.350–7.450)
pCO2 arterial: 52 mmHg — ABNORMAL HIGH (ref 32.0–48.0)
pO2, Arterial: 464 mmHg — ABNORMAL HIGH (ref 83.0–108.0)

## 2014-12-27 LAB — COMPREHENSIVE METABOLIC PANEL
ALBUMIN: 2.9 g/dL — AB (ref 3.5–5.0)
ALK PHOS: 82 U/L (ref 38–126)
ALT: 13 U/L — AB (ref 14–54)
AST: 28 U/L (ref 15–41)
Anion gap: 8 (ref 5–15)
BUN: 14 mg/dL (ref 6–20)
CALCIUM: 8.7 mg/dL — AB (ref 8.9–10.3)
CO2: 29 mmol/L (ref 22–32)
CREATININE: 1.1 mg/dL — AB (ref 0.44–1.00)
Chloride: 99 mmol/L — ABNORMAL LOW (ref 101–111)
GFR calc Af Amer: 49 mL/min — ABNORMAL LOW (ref >60)
GFR calc non Af Amer: 42 mL/min — ABNORMAL LOW (ref >60)
GLUCOSE: 137 mg/dL — AB (ref 65–99)
Potassium: 4.4 mmol/L (ref 3.5–5.1)
SODIUM: 136 mmol/L (ref 135–145)
Total Bilirubin: 0.6 mg/dL (ref 0.3–1.2)
Total Protein: 6.7 g/dL (ref 6.5–8.1)

## 2014-12-27 LAB — CBC
HCT: 42.3 % (ref 35.0–47.0)
HEMOGLOBIN: 13.8 g/dL (ref 12.0–16.0)
MCH: 30.8 pg (ref 26.0–34.0)
MCHC: 32.7 g/dL (ref 32.0–36.0)
MCV: 94.4 fL (ref 80.0–100.0)
Platelets: 265 10*3/uL (ref 150–440)
RBC: 4.49 MIL/uL (ref 3.80–5.20)
RDW: 13.9 % (ref 11.5–14.5)
WBC: 11.6 10*3/uL — ABNORMAL HIGH (ref 3.6–11.0)

## 2014-12-27 LAB — LACTIC ACID, PLASMA
Lactic Acid, Venous: 1.8 mmol/L (ref 0.5–2.0)
Lactic Acid, Venous: 0.9 mmol/L (ref 0.5–2.0)
Lactic Acid, Venous: 1 mmol/L (ref 0.5–2.0)

## 2014-12-27 LAB — URINALYSIS COMPLETE WITH MICROSCOPIC (ARMC ONLY)
Bilirubin Urine: NEGATIVE
Glucose, UA: 50 mg/dL — AB
Nitrite: POSITIVE — AB
PH: 7 (ref 5.0–8.0)
PROTEIN: 30 mg/dL — AB
Specific Gravity, Urine: 1.009 (ref 1.005–1.030)

## 2014-12-27 LAB — TROPONIN I
TROPONIN I: 0.15 ng/mL — AB (ref <0.031)
Troponin I: 0.07 ng/mL — ABNORMAL HIGH (ref <0.031)

## 2014-12-27 LAB — MRSA PCR SCREENING: MRSA BY PCR: NEGATIVE

## 2014-12-27 LAB — GLUCOSE, CAPILLARY: Glucose-Capillary: 113 mg/dL — ABNORMAL HIGH (ref 65–99)

## 2014-12-27 MED ORDER — ACETAMINOPHEN 325 MG PO TABS: 650.0000 mg | ORAL_TABLET | Freq: Four times a day (QID) | ORAL | Status: DC | PRN

## 2014-12-27 MED ORDER — ENOXAPARIN SODIUM 30 MG/0.3ML ~~LOC~~ SOLN
30.0000 mg | SUBCUTANEOUS | Status: DC
  Administered 2014-12-27 – 2014-12-28 (×2): 30 mg via SUBCUTANEOUS
  Filled 2014-12-27 (×2): qty 0.3

## 2014-12-27 MED ORDER — LEVOFLOXACIN IN D5W 750 MG/150ML IV SOLN
750.0000 mg | Freq: Once | INTRAVENOUS | Status: AC
  Administered 2014-12-27: 750 mg via INTRAVENOUS
  Filled 2014-12-27: qty 150

## 2014-12-27 MED ORDER — ONDANSETRON HCL 4 MG PO TABS: 4.0000 mg | ORAL_TABLET | Freq: Four times a day (QID) | ORAL | Status: DC | PRN

## 2014-12-27 MED ORDER — ONDANSETRON HCL 4 MG/2ML IJ SOLN: 4.0000 mg | Freq: Four times a day (QID) | INTRAMUSCULAR | Status: DC | PRN

## 2014-12-27 MED ORDER — OXYCODONE HCL 5 MG PO TABS: 5.0000 mg | ORAL_TABLET | ORAL | Status: DC | PRN

## 2014-12-27 MED ORDER — ACETAMINOPHEN 650 MG RE SUPP: 650.0000 mg | Freq: Four times a day (QID) | RECTAL | Status: DC | PRN

## 2014-12-27 MED ORDER — ALBUTEROL SULFATE (2.5 MG/3ML) 0.083% IN NEBU: 2.5000 mg | INHALATION_SOLUTION | RESPIRATORY_TRACT | Status: DC | PRN

## 2014-12-27 MED ORDER — PIPERACILLIN-TAZOBACTAM 3.375 G IVPB 30 MIN
3.3750 g | Freq: Three times a day (TID) | INTRAVENOUS | Status: DC
  Administered 2014-12-27 – 2014-12-29 (×5): 3.375 g via INTRAVENOUS
  Filled 2014-12-27 (×8): qty 50

## 2014-12-27 MED ORDER — SODIUM CHLORIDE 0.9 % IV BOLUS (SEPSIS)
500.0000 mL | Freq: Once | INTRAVENOUS | Status: AC
  Administered 2014-12-27: 500 mL via INTRAVENOUS

## 2014-12-27 MED ORDER — DOCUSATE SODIUM 100 MG PO CAPS
100.0000 mg | ORAL_CAPSULE | Freq: Two times a day (BID) | ORAL | Status: DC
  Administered 2014-12-28 – 2014-12-29 (×3): 100 mg via ORAL
  Filled 2014-12-27 (×3): qty 1

## 2014-12-27 MED ORDER — SODIUM CHLORIDE 0.9 % IJ SOLN
3.0000 mL | Freq: Two times a day (BID) | INTRAMUSCULAR | Status: DC
  Administered 2014-12-27 – 2014-12-29 (×4): 3 mL via INTRAVENOUS

## 2014-12-27 MED ORDER — LEVOFLOXACIN IN D5W 500 MG/100ML IV SOLN: 500.0000 mg | INTRAVENOUS | Status: DC

## 2014-12-27 NOTE — Progress Notes (Signed)
ANTIBIOTIC CONSULT NOTE - INITIAL  Pharmacy Consult for levofloxacin Indication: pneumonia  Allergies  Allergen Reactions  . Sulfa Antibiotics Other (See Comments)    Reaction:  Unknown     Patient Measurements: Height:  (134.6 cm) Weight: 108 lb 14.5 oz (49.4 kg) IBW/kg (Calculated) : 29.4  Vital Signs: Temp: 97.4 F (36.3 C) (10/03 1402) Temp Source: Oral (10/03 1402) BP: 137/60 mmHg (10/03 1730) Pulse Rate: 79 (10/03 1730)  Labs:  Recent Labs  12/27/14 1350  WBC 11.6*  HGB 13.8  PLT 265  CREATININE 1.10*   Estimated Creatinine Clearance: 19.3 mL/min (by C-G formula based on Cr of 1.1).  Microbiology: No results found for this or any previous visit (from the past 720 hour(s)).  Medical History: Past Medical History  Diagnosis Date  . Hypertension   . Aortic stenosis   . OSA (obstructive sleep apnea)   . Hyperlipidemia   . Osteoarthritis   . Dementia   . Osteoporosis   . AAA (abdominal aortic aneurysm) (HCC)   . Pulmonary hypertension (HCC)   . Depressive disorder    Medications:  Anti-infectives    Start     Dose/Rate Route Frequency Ordered Stop   12/29/14 1600  levofloxacin (LEVAQUIN) IVPB 500 mg     500 mg 100 mL/hr over 60 Minutes Intravenous Every 48 hours 12/27/14 1837     12/27/14 2200  piperacillin-tazobactam (ZOSYN) IVPB 3.375 g     3.375 g 100 mL/hr over 30 Minutes Intravenous 3 times per day 12/27/14 1750     12/27/14 1600  levofloxacin (LEVAQUIN) IVPB 750 mg     750 mg 100 mL/hr over 90 Minutes Intravenous  Once 12/27/14 1551 12/27/14 1746     Assessment: Pharmacy consulted to dose levofloxacin in this 81 year old female with an estimated CrCl ~ 19 mL/min for CAP.   Plan:  Patient received 750 mg levofloxacin x1 in the ED Started levofloxacin 500 mg IV q48 hours  Pharmacy will continue to monitor renal function and adjust dose if necessary. Thank you for the consult.  Jodelle Red Dontea Corlew 12/27/2014,7:06 PM

## 2014-12-27 NOTE — H&P (Signed)
[facility] - [place] at [facility]   PATIENT NAME: Alexis Boyle    MR#:  [id number]  DATE OF BIRTH:  04/05/1919  DATE OF ADMISSION:  12/27/2014  PRIMARY CARE PHYSICIAN: Lauro Regulus., MD   REQUESTING/REFERRING PHYSICIAN: dr.Kineer  CHIEF COMPLAINT:  Sob and hypotension  HISTORY OF PRESENT ILLNESS:  Alexis Boyle  is a 81 y.o. female with a known history of severe aortic stenosis, chronic paroxysmal atrial fibrillation, AAA and hypertension was recently admitted to the hospital and was discharged on September 27. Patient was treated with levofloxacin for left lower lobe pneumonia and chronic atrial fibrillation during the hospital course. After going home patient clinically improved and her shortness of breath is better for a few days but subsequently it has gotten worse for the past 3 days.. Patient's CAT scan of the chest has revealed consolidation on the left lower lobe. Patient was very hypotensive with blood pressure 60/40 prior to EMS arrival and subsequently it was at 80/40 after EMS arrival. Patient is very short of breath and using all her accessory muscles during my examination. Patient was started on BiPAP and IV levofloxacin was given in the ED  PAST MEDICAL HISTORY:   Past Medical History  Diagnosis Date  . Hypertension   . Aortic stenosis   . OSA (obstructive sleep apnea)   . Hyperlipidemia   . Osteoarthritis   . Dementia   . Osteoporosis   . AAA (abdominal aortic aneurysm) (HCC)   . Pulmonary hypertension (HCC)   . Depressive disorder     PAST SURGICAL HISTOIRY:   Past Surgical History  Procedure Laterality Date  . Appendectomy    . Cataract extraction    . Hernia repair      ventral + inguinal    SOCIAL HISTORY:   Social History  Substance Use Topics  . Smoking status: Former Games developer  . Smokeless tobacco: Not on file  . Alcohol Use: Not on file    FAMILY HISTORY:   Family History  Problem Relation Age of  Onset  . CAD Mother     DRUG ALLERGIES:   Allergies  Allergen Reactions  . Sulfa Antibiotics Other (See Comments)    Reaction:  Unknown     REVIEW OF SYSTEMS:  CONSTITUTIONAL: No fever, has fatigue and  weakness.  EYES:Has chronic  blurry vision  EARS, NOSE, AND THROAT: No tinnitus or ear pain.  RESPIRATORY: Reports shortness of breath, no wheezing or hemoptysis.  CARDIOVASCULAR: No chest pain, orthopnea, edema.  GASTROINTESTINAL: No nausea, vomiting, diarrhea or abdominal pain.  GENITOURINARY: No dysuria, hematuria.  ENDOCRINE: No polyuria, nocturia,  HEMATOLOGY: No anemia, easy bruising or bleeding   MEDICATIONS AT HOME:   Prior to Admission medications   Medication Sig Start Date End Date Taking? Authorizing Provider  aspirin EC 81 MG tablet Take 81 mg by mouth at bedtime.    Yes Historical Provider, MD  Cholecalciferol (VITAMIN D3) 2000 UNITS capsule Take 2,000 Units by mouth at bedtime.   Yes Historical Provider, MD  citalopram (CELEXA) 10 MG tablet Take 10 mg by mouth at bedtime.    Yes Historical Provider, MD  docusate sodium (COLACE) 100 MG capsule Take 100 mg by mouth 2 (two) times daily.   Yes Historical Provider, MD  fluticasone (FLONASE) 50 MCG/ACT nasal spray Place 2 sprays into both nostrils daily.   Yes Historical Provider, MD  ipratropium (ATROVENT) 0.03 % nasal spray Place 2 sprays into both nostrils at bedtime.   Yes Historical  Provider, MD  Multiple Vitamin (MULTIVITAMIN WITH MINERALS) TABS tablet Take 1 tablet by mouth daily.   Yes Historical Provider, MD  Multiple Vitamins-Minerals (PRESERVISION AREDS 2 PO) Take 1 capsule by mouth 2 (two) times daily.    Yes Historical Provider, MD  Omega-3 Fatty Acids (FISH OIL) 1200 MG CAPS Take 1,200 mg by mouth 2 (two) times daily.   Yes Historical Provider, MD  omeprazole (PRILOSEC) 20 MG capsule Take 20 mg by mouth daily.   Yes Historical Provider, MD  pravastatin (PRAVACHOL) 40 MG tablet Take 40 mg by mouth at  bedtime.    Yes Historical Provider, MD  torsemide (DEMADEX) 10 MG tablet Take 10 mg by mouth daily as needed (for edema).   Yes Historical Provider, MD  vitamin B-12 (CYANOCOBALAMIN) 1000 MCG tablet Take 1,000 mcg by mouth daily.   Yes Historical Provider, MD  vitamin E (VITAMIN E) 400 UNIT capsule Take 400 Units by mouth daily.   Yes Historical Provider, MD  levofloxacin (LEVAQUIN) 500 MG tablet Take 1 tablet (500 mg total) by mouth daily. Patient not taking: Reported on 12/27/2014 12/21/14   Enid Baas, MD      VITAL SIGNS:  Blood pressure 137/60, pulse 79, temperature 97.4 F (36.3 C), temperature source Oral, resp. rate 19, height  (1.346 m), weight 49.4 kg (108 lb 14.5 oz), SpO2 98 %.  PHYSICAL EXAMINATION:  GENERAL:  81 y.o.-year-old patient lying in the bed with  acute respiratory distress.  EYES: Pupils equal, round, reactive to light and accommodation. No scleral icterus.  HEENT: Head atraumatic, normocephalic. Oropharynx and nasopharynx clear.  NECK:  Supple, no jugular venous distention. No thyroid enlargement, no tenderness.  LUNGS:   course and diminished breath sounds bilaterally.positive ,rhonchi or crepitation. Positive use of accessory muscles of respiration.  CARDIOVASCULAR: S1, S2 normal. very loud ejection systolic murmurs at 4 out of 5  rubs, or gallops.  ABDOMEN: Soft, nontender, nondistended. Bowel sounds present. No organomegaly or mass.  EXTREMITIES: No pedal edema, cyanosis, or clubbing.  NEUROLOGIC: Patient is awake and alert. Could not examine nervous system as patient is under acute respiratory distress PSYCHIATRIC: The patient is alert and oriented x 2  SKIN: No obvious rash, lesion, or ulcer.   LABORATORY PANEL:   CBC  Recent Labs Lab 12/27/14 1350  WBC 11.6*  HGB 13.8  HCT 42.3  PLT 265   ------------------------------------------------------------------------------------------------------------------  Chemistries   Recent Labs Lab  12/27/14 1350  NA 136  K 4.4  CL 99*  CO2 29  GLUCOSE 137*  BUN 14  CREATININE 1.10*  CALCIUM 8.7*  AST 28  ALT 13*  ALKPHOS 82  BILITOT 0.6   ------------------------------------------------------------------------------------------------------------------  Cardiac Enzymes  Recent Labs Lab 12/27/14 1350  TROPONINI 0.07*   ------------------------------------------------------------------------------------------------------------------  RADIOLOGY:  Ct Chest Wo Contrast  12/27/2014   CLINICAL DATA:  81 year old diabetic hypertensive female with pulmonary hypertension presenting with low oxygen saturation and low blood pressure. Subsequent encounter.  EXAM: CT CHEST WITHOUT CONTRAST  TECHNIQUE: Multidetector CT imaging of the chest was performed following the standard protocol without IV contrast.  COMPARISON:  12/27/2014, 12/20/2014 and 10/23/2014 chest x-ray.  FINDINGS: Consolidation left base with small pleural effusion may represent infiltrate or atelectasis. No obstructing lesion to suggest this represents result of central mass.  Minimal atelectatic changes right lung base.  Fracture of the left third through seventh rib.  No pneumothorax.  Atherosclerotic type changes of the thoracic and abdominal aorta with ectasia.  Cardiomegaly without pericardial effusion.  Prominent coronary artery calcifications.  Significant scoliosis thoracic and upper lumbar spine.  No adenopathy.  Visualized upper abdominal structures without acute abnormality.  IMPRESSION: Consolidation left base with small pleural effusion may represent infiltrate or atelectasis. No obstructing lesion to suggest this represents result of central mass.  Minimal atelectatic changes right lung base.  Fracture of the left third through seventh rib.  No pneumothorax.  Atherosclerotic type changes of the thoracic and abdominal aorta with ectasia.  Cardiomegaly without pericardial effusion. Prominent coronary artery  calcifications.   Electronically Signed   By: Lacy Duverney M.D.   On: 12/27/2014 17:13   Dg Chest Portable 1 View  12/27/2014   CLINICAL DATA:  Difficulty breathing.  EXAM: PORTABLE CHEST 1 VIEW  COMPARISON:  12/20/2014  FINDINGS: Cardiomediastinal silhouette is mildly enlarged. Mediastinal contours appear intact, considering portable technique. Calcifications of the thoracic aorta are seen.  There is no evidence of focal airspace consolidation, or pneumothorax. There may be bilateral pleural effusions.  Osseous structures are without acute abnormality. Soft tissues are grossly normal.  IMPRESSION: Stably enlarged cardiac silhouette.  Bilateral pleural effusions.  No radiographic evidence of frank pulmonary edema.   Electronically Signed   By: Ted Mcalpine M.D.   On: 12/27/2014 14:40    EKG:   Orders placed or performed during the hospital encounter of 12/27/14  . ED EKG  . ED EKG    IMPRESSION AND PLAN:    1. Acute hypoxic respiratory failure with accessory muscle use secondary to healthcare associated pneumonia Patient is DO NOT RESUSCITATE We'll place her on BiPAP Stat ABGs are ordered Provide IV antibiotics and neb laser treatments Patient is a mouth breather at her baseline, uses CPAP daily at bedtime  2.Sepsis secondary to healthcare associated pneumonia Meets septic criteria with hypotension, tachypnea, leukocytosis and hypoxia We'll start her on broad-spectrum IV antibiotics Zosyn and levofloxacin Will get sputum culture and sensitivity Provide IV fluids  3. Chronic history of paroxysmal atrial fibrillation Currently rate controlled Patient was on baby aspirin Will hold by mouth medications as patient is on BiPAP  4. Chronic hypertension Patient was hypotensive in the ED. Will hold antihypertensives Provide hydration with IV fluids   5. Chronic dementia   6. Obstructive sleep apnea  Currently on BiPAP  continue CPAP daily at bedtime after patient clinically  is improved  7. Hyperlipidemia Will hold her home medication pravastatin     All the records are reviewed and case discussed with ED provider. Management plans discussed with the patient, family and they are in agreement.  CODE STATUS: DO NOT RESUSCITATE, daughters are the healthcare power of attorney TOTAL CRITICAL CARE TIME TAKING CARE OF THIS PATIENT: 45 minutes    Maddi Collar M.D on 12/27/2014 at 5:55 PM  Between 7am to 6pm - Pager - [phone]  After 6pm go to [email or web address] - password [email or web address] [facility]  [facility]  Office  [phone]  CC: Primary care physician; Lauro Regulus., MD

## 2014-12-27 NOTE — ED Notes (Signed)
EMS was called to patient's residence (pt lives with her daughter) for difficulty breathing.  EMS reports that on arrival to scene, pt had O2 sats of 88-95% on roomair and BP down to 80/40.  Subsequent BP's were noted to be steadily increasing to 140's systolic.  On arrival to ED, pt on 2L O2 with O2 sats adequate, BP 95/75 and HR in 60's-70's.  Pt sleeping, but awakens easily ans is oriented to person, place and time.  Pt has had a recent admission to [facility] for pneumonia.

## 2014-12-27 NOTE — ED Notes (Signed)
Pt placed on BiPap per MD Gouru.

## 2014-12-27 NOTE — ED Provider Notes (Signed)
[facility] Emergency Department Provider Note  ____________________________________________  Time seen: On arrival, via EMS  I have reviewed the triage vital signs and the nursing notes.   HISTORY  Chief Complaint Respiratory Distress    HPI Alexis Boyle is a 81 y.o. female who presents with shortness of breath. EMS was called to her residence for shortness of breath. The patient lives with her daughter. Apparently she did have an initial low blood pressure but subsequent blood pressures were more appropriate. Patient recently admitted to [facility] for pneumonia. The patient appears to have some confusion but is overall alert and oriented. She denies chest pain. She denies leg pain or swelling. No fevers no chills.     Past Medical History  Diagnosis Date  . Hypertension   . Aortic stenosis   . OSA (obstructive sleep apnea)   . Hyperlipidemia   . Osteoarthritis   . Dementia   . Osteoporosis   . AAA (abdominal aortic aneurysm) (HCC)   . Pulmonary hypertension (HCC)   . Depressive disorder     Patient Active Problem List   Diagnosis Date Noted  . CAP (community acquired pneumonia) 12/20/2014    Past Surgical History  Procedure Laterality Date  . Appendectomy    . Cataract extraction    . Hernia repair      ventral + inguinal    Current Outpatient Rx  Name  Route  Sig  Dispense  Refill  . aspirin EC 81 MG tablet   Oral   Take 81 mg by mouth at bedtime.          . cholecalciferol (VITAMIN D) 1000 UNITS tablet   Oral   Take 2,000 Units by mouth at bedtime.          . citalopram (CELEXA) 10 MG tablet   Oral   Take 10 mg by mouth at bedtime.          . docusate sodium (COLACE) 100 MG capsule   Oral   Take 100 mg by mouth 2 (two) times daily.         . fluticasone (FLONASE) 50 MCG/ACT nasal spray   Each Nare   Place 2 sprays into both nostrils daily.         Marland Kitchen ipratropium (ATROVENT) 0.03 % nasal  spray   Each Nare   Place 2 sprays into both nostrils at bedtime.         Marland Kitchen levofloxacin (LEVAQUIN) 500 MG tablet   Oral   Take 1 tablet (500 mg total) by mouth daily.   5 tablet   0   . Multiple Vitamin (MULTIVITAMIN WITH MINERALS) TABS tablet   Oral   Take 1 tablet by mouth daily.         . Multiple Vitamins-Minerals (PRESERVISION AREDS 2 PO)   Oral   Take 1 tablet by mouth 2 (two) times daily.          . Omega 3 1000 MG CAPS   Oral   Take 1,000 mg by mouth 2 (two) times daily.         Marland Kitchen omeprazole (PRILOSEC) 20 MG capsule   Oral   Take 20 mg by mouth daily.         . pravastatin (PRAVACHOL) 40 MG tablet   Oral   Take 40 mg by mouth at bedtime.          . vitamin B-12 (CYANOCOBALAMIN) 1000 MCG tablet   Oral  Take 1,000 mcg by mouth daily.         . vitamin E (VITAMIN E) 400 UNIT capsule   Oral   Take 400 Units by mouth daily.           Allergies Sulfa antibiotics  Family History  Problem Relation Age of Onset  . CAD Mother     Social History Social History  Substance Use Topics  . Smoking status: Former Games developer  . Smokeless tobacco: None  . Alcohol Use: None    Review of Systems  Constitutional: Negative for fever. Eyes: Negative for visual changes. ENT: Negative for sore throat Cardiovascular: Negative for chest pain. Respiratory: Positive for shortness of breath Gastrointestinal: Negative for abdominal pain, vomiting and diarrhea. Genitourinary: Negative for dysuria. Musculoskeletal: Negative for back pain. Skin: Negative for rash. Neurological: Negative for headaches or focal weakness Psychiatric: No anxiety    ____________________________________________   PHYSICAL EXAM:  VITAL SIGNS: ED Triage Vitals  Enc Vitals Group     BP 12/27/14 1400 139/50 mmHg     Pulse Rate 12/27/14 1400 69     Resp 12/27/14 1400 26     Temp 12/27/14 1402 97.4 F (36.3 C)     Temp Source 12/27/14 1402 Oral     SpO2 12/27/14 1400 89 %      Weight 12/27/14 1402 108 lb 14.5 oz (49.4 kg)     Height 12/27/14 1402  (1.346 m)     Head Cir --      Peak Flow --      Pain Score --      Pain Loc --      Pain Edu? --      Excl. in GC? --      Constitutional: No acute distress Eyes: Conjunctivae are normal.  ENT   Head: Normocephalic and atraumatic.   Mouth/Throat: Mucous membranes are dry Cardiovascular: Normal rate, regular rhythm. Normal and symmetric distal pulses are present in all extremities. No murmurs, rubs, or gallops. Respiratory: Positive tachypnea, rales bilaterally Gastrointestinal: Soft and non-tender in all quadrants. No distention. There is no CVA tenderness. Genitourinary: deferred Musculoskeletal: Nontender with normal range of motion in all extremities. No lower extremity tenderness nor edema. Neurologic:  Normal speech and language. No gross focal neurologic deficits are appreciated. Skin:  Skin is warm, dry and intact. No rash noted. Psychiatric: Mood and affect are normal. Patient exhibits appropriate insight and judgment.  ____________________________________________    LABS (pertinent positives/negatives)  Labs Reviewed  CBC - Abnormal; Notable for the following:    WBC 11.6 (*)    All other components within normal limits  COMPREHENSIVE METABOLIC PANEL - Abnormal; Notable for the following:    Chloride 99 (*)    Glucose, Bld 137 (*)    Creatinine, Ser 1.10 (*)    Calcium 8.7 (*)    Albumin 2.9 (*)    ALT 13 (*)    GFR calc non Af Amer 42 (*)    GFR calc Af Amer 49 (*)    All other components within normal limits  TROPONIN I - Abnormal; Notable for the following:    Troponin I 0.07 (*)    All other components within normal limits  CULTURE, BLOOD (ROUTINE X 2)  CULTURE, BLOOD (ROUTINE X 2)  LACTIC ACID, PLASMA  LACTIC ACID, PLASMA    ____________________________________________   EKG  ED ECG REPORT I, Jene Every, the attending physician, personally viewed and  interpreted this ECG.  Date: 12/27/2014 EKG  Time: 1:48 PM Rate: 68 Rhythm: normal sinus rhythm QRS Axis: normal Intervals: normal ST/T Wave abnormalities: normal Conduction Disutrbances: none Narrative Interpretation: unremarkable   ____________________________________________    RADIOLOGY I have personally reviewed any xrays that were ordered on this patient: Pleural effusions on chest x-ray  ____________________________________________   PROCEDURES  Procedure(s) performed: none  Critical Care performed: none  ____________________________________________   INITIAL IMPRESSION / ASSESSMENT AND PLAN / ED COURSE  Pertinent labs & imaging results that were available during my care of the patient were reviewed by me and considered in my medical decision making (see chart for details).  Patient presents with O2 sats 86-90% on 2 L nasal cannula. She does not wear oxygen at home. Recent admission for pneumonia. We will send blood cultures, labs, chest x-ray, EKG given a bolus of IV fluids and reevaluate the patient  ____________________________________________   FINAL CLINICAL IMPRESSION(S) / ED DIAGNOSES  Final diagnoses:  Shortness of breath  Hypoxia     Jene Every, MD 12/27/14 1555

## 2014-12-27 NOTE — Progress Notes (Signed)
eLink Physician-Brief Progress Note Patient Name: Alexis Boyle DOB: 09/06/1919 MRN: [id number]   Date of Service  12/27/2014  HPI/Events of Note  New admit. eM eMD eval  On bipap, dnr. resp distress  eICU Interventions  No eMD interventions If decliens, rec morphine gtt     Intervention Category Major Interventions: Respiratory failure - evaluation and management  Daisuke Bailey 12/27/2014, 9:32 PM

## 2014-12-28 LAB — CBC
HEMATOCRIT: 35.7 % (ref 35.0–47.0)
HEMOGLOBIN: 11.8 g/dL — AB (ref 12.0–16.0)
MCH: 31 pg (ref 26.0–34.0)
MCHC: 33.1 g/dL (ref 32.0–36.0)
MCV: 93.7 fL (ref 80.0–100.0)
Platelets: 226 10*3/uL (ref 150–440)
RBC: 3.81 MIL/uL (ref 3.80–5.20)
RDW: 13.8 % (ref 11.5–14.5)
WBC: 7.7 10*3/uL (ref 3.6–11.0)

## 2014-12-28 LAB — BASIC METABOLIC PANEL
ANION GAP: 7 (ref 5–15)
BUN: 17 mg/dL (ref 6–20)
CHLORIDE: 107 mmol/L (ref 101–111)
CO2: 27 mmol/L (ref 22–32)
Calcium: 8.2 mg/dL — ABNORMAL LOW (ref 8.9–10.3)
Creatinine, Ser: 0.93 mg/dL (ref 0.44–1.00)
GFR calc Af Amer: 60 mL/min — ABNORMAL LOW (ref >60)
GFR, EST NON AFRICAN AMERICAN: 52 mL/min — AB (ref >60)
GLUCOSE: 84 mg/dL (ref 65–99)
POTASSIUM: 4 mmol/L (ref 3.5–5.1)
Sodium: 141 mmol/L (ref 135–145)

## 2014-12-28 LAB — URINE CULTURE

## 2014-12-28 LAB — TROPONIN I: Troponin I: 0.14 ng/mL — ABNORMAL HIGH (ref <0.031)

## 2014-12-28 MED ORDER — ASPIRIN EC 81 MG PO TBEC
81.0000 mg | DELAYED_RELEASE_TABLET | Freq: Every day | ORAL | Status: DC
  Administered 2014-12-28: 81 mg via ORAL
  Filled 2014-12-28: qty 1

## 2014-12-28 MED ORDER — CITALOPRAM HYDROBROMIDE 20 MG PO TABS
10.0000 mg | ORAL_TABLET | Freq: Every day | ORAL | Status: DC
  Administered 2014-12-28: 22:00:00 10 mg via ORAL
  Filled 2014-12-28: qty 1

## 2014-12-28 MED ORDER — TORSEMIDE 20 MG PO TABS: 10.0000 mg | ORAL_TABLET | Freq: Every day | ORAL | Status: DC | PRN

## 2014-12-28 MED ORDER — PANTOPRAZOLE SODIUM 40 MG PO TBEC
40.0000 mg | DELAYED_RELEASE_TABLET | Freq: Every day | ORAL | Status: DC
  Administered 2014-12-28 – 2014-12-29 (×2): 40 mg via ORAL
  Filled 2014-12-28 (×2): qty 1

## 2014-12-28 MED ORDER — PRAVASTATIN SODIUM 20 MG PO TABS
40.0000 mg | ORAL_TABLET | Freq: Every day | ORAL | Status: DC
  Administered 2014-12-28: 22:00:00 40 mg via ORAL
  Filled 2014-12-28: qty 2

## 2014-12-28 MED ORDER — LEVOFLOXACIN IN D5W 750 MG/150ML IV SOLN
750.0000 mg | INTRAVENOUS | Status: DC
  Filled 2014-12-28: qty 150

## 2014-12-28 NOTE — Progress Notes (Signed)
[facility] Physicians - [place] at [facility]   PATIENT NAME: Alexis Boyle    MR#:  [id number]  DATE OF BIRTH:  [birth year]  SUBJECTIVE:  Patient subjectively feeling better. Denies shortness of breath or chest pain.  REVIEW OF SYSTEMS:    Review of Systems  Unable to perform ROS: dementia    Tolerating Diet:yes      DRUG ALLERGIES:   Allergies  Allergen Reactions  . Sulfa Antibiotics Other (See Comments)    Reaction:  Unknown     VITALS:  Blood pressure 129/57, pulse 69, temperature 98.4 F (36.9 C), temperature source Oral, resp. rate 27, height 5' (1.524 m), weight 46.8 kg (103 lb 2.8 oz), SpO2 92 %.  PHYSICAL EXAMINATION:   Physical Exam  Constitutional: She is oriented to person, place, and time and well-developed, well-nourished, and in no distress. No distress.  HENT:  Head: Normocephalic.  Eyes: No scleral icterus.  Neck: Normal range of motion. Neck supple. No JVD present. No tracheal deviation present.  Cardiovascular: Normal rate and regular rhythm.  Exam reveals no gallop and no friction rub.   Murmur heard. Pulmonary/Chest: Effort normal and breath sounds normal. No respiratory distress. She has no wheezes. She has no rales. She exhibits no tenderness.  Abdominal: Soft. Bowel sounds are normal. She exhibits no distension and no mass. There is no tenderness. There is no rebound and no guarding.  Musculoskeletal: Normal range of motion. She exhibits no edema.  Neurological: She is alert and oriented to person, place, and time.  Skin: Skin is warm. No rash noted. No erythema.  Psychiatric: Affect and judgment normal.      LABORATORY PANEL:   CBC  Recent Labs Lab 12/28/14 0647  WBC 7.7  HGB 11.8*  HCT 35.7  PLT 226   ------------------------------------------------------------------------------------------------------------------  Chemistries   Recent Labs Lab 12/27/14 1350 12/28/14 0647  NA 136 141  K 4.4 4.0  CL  99* 107  CO2 29 27  GLUCOSE 137* 84  BUN 14 17  CREATININE 1.10* 0.93  CALCIUM 8.7* 8.2*  AST 28  --   ALT 13*  --   ALKPHOS 82  --   BILITOT 0.6  --    ------------------------------------------------------------------------------------------------------------------  Cardiac Enzymes  Recent Labs Lab 12/27/14 1350 12/27/14 2146 12/28/14 0647  TROPONINI 0.07* 0.15* 0.14*   ------------------------------------------------------------------------------------------------------------------  RADIOLOGY:  Ct Chest Wo Contrast  12/27/2014   CLINICAL DATA:  81 year old diabetic hypertensive female with pulmonary hypertension presenting with low oxygen saturation and low blood pressure. Subsequent encounter.  EXAM: CT CHEST WITHOUT CONTRAST  TECHNIQUE: Multidetector CT imaging of the chest was performed following the standard protocol without IV contrast.  COMPARISON:  12/27/2014, 12/20/2014 and 10/23/2014 chest x-ray.  FINDINGS: Consolidation left base with small pleural effusion may represent infiltrate or atelectasis. No obstructing lesion to suggest this represents result of central mass.  Minimal atelectatic changes right lung base.  Fracture of the left third through seventh rib.  No pneumothorax.  Atherosclerotic type changes of the thoracic and abdominal aorta with ectasia.  Cardiomegaly without pericardial effusion. Prominent coronary artery calcifications.  Significant scoliosis thoracic and upper lumbar spine.  No adenopathy.  Visualized upper abdominal structures without acute abnormality.  IMPRESSION: Consolidation left base with small pleural effusion may represent infiltrate or atelectasis. No obstructing lesion to suggest this represents result of central mass.  Minimal atelectatic changes right lung base.  Fracture of the left third through seventh rib.  No pneumothorax.  Atherosclerotic type changes of  the thoracic and abdominal aorta with ectasia.  Cardiomegaly without pericardial  effusion. Prominent coronary artery calcifications.   Electronically Signed   By: Lacy Duverney M.D.   On: 12/27/2014 17:13   Dg Chest Portable 1 View  12/27/2014   CLINICAL DATA:  Difficulty breathing.  EXAM: PORTABLE CHEST 1 VIEW  COMPARISON:  12/20/2014  FINDINGS: Cardiomediastinal silhouette is mildly enlarged. Mediastinal contours appear intact, considering portable technique. Calcifications of the thoracic aorta are seen.  There is no evidence of focal airspace consolidation, or pneumothorax. There may be bilateral pleural effusions.  Osseous structures are without acute abnormality. Soft tissues are grossly normal.  IMPRESSION: Stably enlarged cardiac silhouette.  Bilateral pleural effusions.  No radiographic evidence of frank pulmonary edema.   Electronically Signed   By: Ted Mcalpine M.D.   On: 12/27/2014 14:40     ASSESSMENT AND PLAN:   81 year old female with severe aortic stenosis, chronic. PAF who was readmitted for acute hypoxic respiratory failure.   1. Acute hypoxic respiratory failure: This is secondary to healthcare associated pneumonia. Chest x-ray shows left lower lobe consolidation. Patient is no longer hypoxic and her O2 sat duration is 95% on room air. Patient has improved with antibiotics.  2. Sepsis secondary to healthcare acquired pneumonia: Patient presents with hypotension, tachypnea, leukocytosis and hypoxia. Her sepsis is resolving. Continue broad-spectrum antibiotics including Zosyn and Levaquin. Follow up on blood cultures.  3.PAF: heart rate is controlled. Continue an aspirin. She is not a good candidate for anticoagulation due to Alzheimer's dementia and fall risk.  4. Alzheimer dementia: Stable  5. OSA: Continue CPAP at bedtime  6. Hyperlipidemia: continue statin   CODE STATUS: DNR  TOTAL TIME TAKING CARE OF THIS PATIENT: 30 minutes.     POSSIBLE D/C 102 days, DEPENDING ON CLINICAL CONDITION.   Vineeth Fell M.D on 12/28/2014 at 10:59  AM  Between 7am to 6pm - Pager - [phone] After 6pm go to [email or web address] - password [email or web address]  [facility]  Office  [phone]  CC: Primary care physician; Lauro Regulus., MD  Note: This dictation was prepared with Dragon dictation along with smaller phrase technology. Any transcriptional errors that result from this process are unintentional.

## 2014-12-28 NOTE — Plan of Care (Signed)
Problem: Discharge Progression Outcomes Goal: Discharge plan in place and appropriate Outcome: Progressing 1. Like to be called Alexis Boyle 2. Lives with daughter 3. Hx of HTN, dementia, aortic stenosis, hyperlipidemia, and depressive disorder controlled by home meds. Goal: Other Discharge Outcomes/Goals Outcome: Progressing Plan of care progress to goal for: 1. Pain-no c/o pain this shift 2. Hemodynamically-             -VSS, pt remains afebrile this shift 3. Complications-no evidence of this shift 4. Diet-pt tolerating diet this shift 5. Activity-pt is 2 assist to the [facility]

## 2014-12-28 NOTE — Progress Notes (Signed)
   12/28/14 0854  Clinical Encounter Type  Visited With Patient  Visit Type Initial;Spiritual support  Referral From Nurse  Consult/Referral To Chaplain  Spiritual Encounters  Spiritual Needs Prayer  Rec'd order for prayer. Spoke with pt. who said she did not need anything right now. Advised her that we are available when she needs [place]. Chap. Raiford Noble [phone]

## 2014-12-28 NOTE — Progress Notes (Signed)
ANTIBIOTIC CONSULT NOTE - FOLLOW UP   Pharmacy Consult for levofloxacin Indication: pneumonia  Allergies  Allergen Reactions  . Sulfa Antibiotics Other (See Comments)    Reaction:  Unknown     Patient Measurements: Height: 5' (152.4 cm) Weight: 103 lb 2.8 oz (46.8 kg) IBW/kg (Calculated) : 45.5  Vital Signs: Temp: 98.4 F (36.9 C) (10/04 0732) Temp Source: Oral (10/04 0732) BP: 96/52 mmHg (10/04 0700) Pulse Rate: 65 (10/04 0700)  Labs:  Recent Labs  12/27/14 1350 12/28/14 0647  WBC 11.6* 7.7  HGB 13.8 11.8*  PLT 265 226  CREATININE 1.10* 0.93   Estimated Creatinine Clearance: 27.7 mL/min (by C-G formula based on Cr of 0.93).  Microbiology: Recent Results (from the past 720 hour(s))  MRSA PCR Screening     Status: None   Collection Time: 12/27/14  1:38 PM  Result Value Ref Range Status   MRSA by PCR NEGATIVE NEGATIVE Final    Comment:        The GeneXpert MRSA Assay (FDA approved for NASAL specimens only), is one component of a comprehensive MRSA colonization surveillance program. It is not intended to diagnose MRSA infection nor to guide or monitor treatment for MRSA infections.     Medical History: Past Medical History  Diagnosis Date  . Hypertension   . Aortic stenosis   . OSA (obstructive sleep apnea)   . Hyperlipidemia   . Osteoarthritis   . Dementia   . Osteoporosis   . AAA (abdominal aortic aneurysm) (HCC)   . Pulmonary hypertension (HCC)   . Depressive disorder    Medications:  Anti-infectives    Start     Dose/Rate Route Frequency Ordered Stop   12/29/14 1800  levofloxacin (LEVAQUIN) IVPB 750 mg     750 mg 100 mL/hr over 90 Minutes Intravenous Every 48 hours 12/28/14 0820     12/29/14 1600  levofloxacin (LEVAQUIN) IVPB 500 mg  Status:  Discontinued     500 mg 100 mL/hr over 60 Minutes Intravenous Every 48 hours 12/27/14 1837 12/28/14 0820   12/27/14 2200  piperacillin-tazobactam (ZOSYN) IVPB 3.375 g     3.375 g 100 mL/hr over 30  Minutes Intravenous 3 times per day 12/27/14 1750     12/27/14 1600  levofloxacin (LEVAQUIN) IVPB 750 mg     750 mg 100 mL/hr over 90 Minutes Intravenous  Once 12/27/14 1551 12/27/14 1746     Assessment: Pharmacy consulted to dose levofloxacin in this 81 year old female with an estimated CrCl ~ 28 mL/min for respiratory failure secondary to pneumonia. .   Plan:  Patient received 750 mg levofloxacin x1 in the ED. Will increase Levofloxacin dose to 750 mg IV q48 hours since patient's renal function has improved.   Pharmacy will continue to monitor renal function and adjust dose if necessary. Thank you for the consult.  Meleana Commerford D 12/28/2014,8:20 AM

## 2014-12-28 NOTE — Plan of Care (Signed)
Problem: Consults Goal: General Medical Patient Education See Patient Education Module for specific education.  Outcome: Not Progressing Dementia Goal: Skin Care Protocol Initiated - if Braden Score 18 or less If consults are not indicated, leave blank or document N/A  Outcome: Progressing Pink foam to sacrum Goal: Nutrition Consult-if indicated Outcome: Progressing Spoke with dietician.  Passed bedside swallow eval.  Soft regular diet ordered  Problem: Phase I Progression Outcomes Goal: OOB as tolerated unless otherwise ordered Outcome: Progressing OOB to chair with max assist Goal: Initial discharge plan identified Outcome: Progressing Daughter prefers she return home with her Home health and PT Goal: Voiding-avoid urinary catheter unless indicated Outcome: Progressing Voids in diaper Goal: Hemodynamically stable Outcome: Progressing VSS.  Good sats on room air.

## 2014-12-28 NOTE — Progress Notes (Signed)
Alert and confused. Denies pain.  Lung fields diminished throughout. BIPAP off at 6am and doing well on room air. Weak cough effort. NSR. Out of bed to chair for lunch with 2 max assist. Wears brief for urinary incontinence.  Daughter at bedside.  Prepare to transfer to room 118.  Report given

## 2014-12-29 MED ORDER — CEFUROXIME AXETIL 500 MG PO TABS: 500.0000 mg | ORAL_TABLET | Freq: Two times a day (BID) | ORAL | Status: AC

## 2014-12-29 MED ORDER — ENSURE ENLIVE PO LIQD
237.0000 mL | Freq: Two times a day (BID) | ORAL | Status: DC
  Administered 2014-12-29: 11:00:00 237 mL via ORAL

## 2014-12-29 NOTE — Plan of Care (Signed)
Problem: Discharge Progression Outcomes Goal: Other Discharge Outcomes/Goals Outcome: Progressing Plan of care progress to goals: 1. No c/o pain, resting comfortably in bed.  2. Hemodynamically:             -VSS, afrebile, CPAP remains on throughout the night, O2 sats stable             -IV Abx given as scheduled  3. Tolerating soft foods diet, fair appetite. Requires meds crushed in applesauce 4. High fall risk. Bed alarm on, room close to nurses station. +2 assist to [facility] when not incontinent.  5. Pt lives at home with daughter who cares for pt; very supportive & involved in care; at bedside until late last night.

## 2014-12-29 NOTE — Discharge Summary (Signed)
[facility] Physicians - [place] at [facility]   PATIENT NAME: Alexis Boyle    MR#:  [id number]  DATE OF BIRTH:  03/31/1919  DATE OF ADMISSION:  12/27/2014 ADMITTING PHYSICIAN: Ramonita Lab, MD  DATE OF DISCHARGE: 12/28/2014 PRIMARY CARE PHYSICIAN: Lauro Regulus., MD    ADMISSION DIAGNOSIS:  Shortness of breath [R06.02] Hypoxia [R09.02]  DISCHARGE DIAGNOSIS:  Active Problems:   Acute respiratory failure with hypoxia (HCC)   SECONDARY DIAGNOSIS:   Past Medical History  Diagnosis Date  . Hypertension   . Aortic stenosis   . OSA (obstructive sleep apnea)   . Hyperlipidemia   . Osteoarthritis   . Dementia   . Osteoporosis   . AAA (abdominal aortic aneurysm) (HCC)   . Pulmonary hypertension (HCC)   . Depressive disorder     HOSPITAL COURSE:  81 year old female with severe aortic stenosis, chronic. PAF who was readmitted for acute hypoxic respiratory failure.   1. Acute hypoxic respiratory failure: This is secondary to healthcare associated pneumonia. Chest x-ray shows left lower lobe consolidation. Patient is no longer hypoxic and her O2 saturation is 100% on room air. Patient has improved with antibiotics.  2. Sepsis secondary to healthcare acquired pneumonia: Patient presented with hypotension, tachypnea, leukocytosis and hypoxia. Her sepsis is resolving. She was on broad-spectrum antibiotics including Zosyn and Levaquin. Blood cultures are negative to date. She will be discharged on Ceftin  3.PAF: heart rate is controlled. Continue an aspirin. She is not a good candidate for anticoagulation due to Alzheimer's dementia and fall risk.  4. Alzheimer dementia: Stable  5. OSA: Continue CPAP at bedtime  6. Hyperlipidemia: continue statin  7. UTI : started on CEFTIN. Urine culture shows contamination. DISCHARGE CONDITIONS AND DIET:  Patient being discharged with home health care in fair condition on a regular diet. Physical therapy recommended  skilled nursing facility, however patient's family would like patient to go home at discharge  CONSULTS OBTAINED:  Treatment Team:  Ramonita Lab, MD  DRUG ALLERGIES:   Allergies  Allergen Reactions  . Sulfa Antibiotics Other (See Comments)    Reaction:  Unknown     DISCHARGE MEDICATIONS:   Current Discharge Medication List    START taking these medications   Details  cefUROXime (CEFTIN) 500 MG tablet Take 1 tablet (500 mg total) by mouth 2 (two) times daily with a meal. Qty: 10 tablet, Refills: 0      CONTINUE these medications which have NOT CHANGED   Details  aspirin EC 81 MG tablet Take 81 mg by mouth at bedtime.     Cholecalciferol (VITAMIN D3) 2000 UNITS capsule Take 2,000 Units by mouth at bedtime.    citalopram (CELEXA) 10 MG tablet Take 10 mg by mouth at bedtime.     docusate sodium (COLACE) 100 MG capsule Take 100 mg by mouth 2 (two) times daily.    fluticasone (FLONASE) 50 MCG/ACT nasal spray Place 2 sprays into both nostrils daily.    ipratropium (ATROVENT) 0.03 % nasal spray Place 2 sprays into both nostrils at bedtime.    Multiple Vitamin (MULTIVITAMIN WITH MINERALS) TABS tablet Take 1 tablet by mouth daily.    Multiple Vitamins-Minerals (PRESERVISION AREDS 2 PO) Take 1 capsule by mouth 2 (two) times daily.     Omega-3 Fatty Acids (FISH OIL) 1200 MG CAPS Take 1,200 mg by mouth 2 (two) times daily.    omeprazole (PRILOSEC) 20 MG capsule Take 20 mg by mouth daily.    pravastatin (PRAVACHOL) 40 MG tablet Take  40 mg by mouth at bedtime.     torsemide (DEMADEX) 10 MG tablet Take 10 mg by mouth daily as needed (for edema).    vitamin B-12 (CYANOCOBALAMIN) 1000 MCG tablet Take 1,000 mcg by mouth daily.    vitamin E (VITAMIN E) 400 UNIT capsule Take 400 Units by mouth daily.      STOP taking these medications     levofloxacin (LEVAQUIN) 500 MG tablet               Today   CHIEF COMPLAINT:  Patient with dementia. She has no complaints. No  acute events overnight   VITAL SIGNS:  Blood pressure 152/64, pulse 55, temperature 97.6 F (36.4 C), temperature source Oral, resp. rate 18, height 5' (1.524 m), weight 46.8 kg (103 lb 2.8 oz), SpO2 100 %.   REVIEW OF SYSTEMS:  Review of Systems  Unable to perform ROS: dementia     PHYSICAL EXAMINATION:  GENERAL:  81 y.o.-year-old patient lying in the bed with no acute distress.  NECK:  Supple, no jugular venous distention. No thyroid enlargement, no tenderness.  LUNGS: Normal breath sounds bilaterally, no wheezing, rales,rhonchi  No use of accessory muscles of respiration.  CARDIOVASCULAR: Regular rate and rhythm, 3/ SEM norubs, or gallops.  ABDOMEN: Soft, non-tender, non-distended. Bowel sounds present. No organomegaly or mass.  EXTREMITIES: No pedal edema, cyanosis, or clubbing.  PSYCHIATRIC: The patient is alert and oriented to name  SKIN: No obvious rash, lesion, or ulcer.   DATA REVIEW:   CBC  Recent Labs Lab 12/28/14 0647  WBC 7.7  HGB 11.8*  HCT 35.7  PLT 226    Chemistries   Recent Labs Lab 12/27/14 1350 12/28/14 0647  NA 136 141  K 4.4 4.0  CL 99* 107  CO2 29 27  GLUCOSE 137* 84  BUN 14 17  CREATININE 1.10* 0.93  CALCIUM 8.7* 8.2*  AST 28  --   ALT 13*  --   ALKPHOS 82  --   BILITOT 0.6  --     Cardiac Enzymes  Recent Labs Lab 12/27/14 1350 12/27/14 2146 12/28/14 0647  TROPONINI 0.07* 0.15* 0.14*    Microbiology Results  @  RADIOLOGY:  Ct Chest Wo Contrast  12/27/2014   CLINICAL DATA:  81 year old diabetic hypertensive female with pulmonary hypertension presenting with low oxygen saturation and low blood pressure. Subsequent encounter.  EXAM: CT CHEST WITHOUT CONTRAST  TECHNIQUE: Multidetector CT imaging of the chest was performed following the standard protocol without IV contrast.  COMPARISON:  12/27/2014, 12/20/2014 and 10/23/2014 chest x-ray.  FINDINGS: Consolidation left base with small pleural effusion may represent  infiltrate or atelectasis. No obstructing lesion to suggest this represents result of central mass.  Minimal atelectatic changes right lung base.  Fracture of the left third through seventh rib.  No pneumothorax.  Atherosclerotic type changes of the thoracic and abdominal aorta with ectasia.  Cardiomegaly without pericardial effusion. Prominent coronary artery calcifications.  Significant scoliosis thoracic and upper lumbar spine.  No adenopathy.  Visualized upper abdominal structures without acute abnormality.  IMPRESSION: Consolidation left base with small pleural effusion may represent infiltrate or atelectasis. No obstructing lesion to suggest this represents result of central mass.  Minimal atelectatic changes right lung base.  Fracture of the left third through seventh rib.  No pneumothorax.  Atherosclerotic type changes of the thoracic and abdominal aorta with ectasia.  Cardiomegaly without pericardial effusion. Prominent coronary artery calcifications.   Electronically Signed   By: Lacy Duverney  M.D.   On: 12/27/2014 17:13   Dg Chest Portable 1 View  12/27/2014   CLINICAL DATA:  Difficulty breathing.  EXAM: PORTABLE CHEST 1 VIEW  COMPARISON:  12/20/2014  FINDINGS: Cardiomediastinal silhouette is mildly enlarged. Mediastinal contours appear intact, considering portable technique. Calcifications of the thoracic aorta are seen.  There is no evidence of focal airspace consolidation, or pneumothorax. There may be bilateral pleural effusions.  Osseous structures are without acute abnormality. Soft tissues are grossly normal.  IMPRESSION: Stably enlarged cardiac silhouette.  Bilateral pleural effusions.  No radiographic evidence of frank pulmonary edema.   Electronically Signed   By: Ted Mcalpine M.D.   On: 12/27/2014 14:40      I left a message with the patient's daughter. Discussed with case management Stable for discharge home with home health care  Patient should follow up with PCP in 1  week  CODE STATUS:     Code Status Orders        Start     Ordered   12/27/14 2045  Do not attempt resuscitation (DNR)   Continuous    Question Answer Comment  In the event of cardiac or respiratory ARREST Do not call a "code blue"   In the event of cardiac or respiratory ARREST Do not perform Intubation, CPR, defibrillation or ACLS   In the event of cardiac or respiratory ARREST Use medication by any route, position, wound care, and other measures to relive pain and suffering. May use oxygen, suction and manual treatment of airway obstruction as needed for comfort.   Comments RN may pronounce the patient      12/27/14 2044    Advance Directive Documentation        Most Recent Value   Type of Advance Directive  Living will, Out of facility DNR (pink MOST or yellow form)   Pre-existing out of facility DNR order (yellow form or pink MOST form)     "MOST" Form in Place?        TOTAL TIME TAKING CARE OF THIS PATIENT: 35 minutes.    Laquilla Dault M.D on 12/29/2014 at 10:37 AM  Between 7am to 6pm - Pager - [phone] After 6pm go to [email or web address] - password [email or web address] [facility]  [place] [facility]  Office  [phone]  CC: Primary care physician; Lauro Regulus., MD

## 2014-12-29 NOTE — Progress Notes (Signed)
Initial Nutrition Assessment   INTERVENTION:   Meals and Snacks: Cater to patient preferences Medical Food Supplement Therapy: will recommend Ensure Enlive po BID, each supplement provides 350 kcal and 20 grams of protein   NUTRITION DIAGNOSIS:   Inadequate oral intake related to acute illness as evidenced by per patient/family report.  GOAL:   Patient will meet greater than or equal to 90% of their needs  MONITOR:    (Energy Intake, Anthropometrics, Digestive System, Pulmonary Profile)  REASON FOR ASSESSMENT:   Malnutrition Screening Tool    ASSESSMENT:   Pt admitted with sepsis secondary to healthcare acquired pna. Pt now off bipap, resting in room. RD notes discharge just ordered.  Past Medical History  Diagnosis Date  . Hypertension   . Aortic stenosis   . OSA (obstructive sleep apnea)   . Hyperlipidemia   . Osteoarthritis   . Dementia   . Osteoporosis   . AAA (abdominal aortic aneurysm) (HCC)   . Pulmonary hypertension (HCC)   . Depressive disorder      Diet Order:  DIET SOFT Room service appropriate?: Yes; Fluid consistency:: Thin    Current Nutrition: Pt tray at bedside untouched as pt remains asleep.  Food/Nutrition-Related History: Per [facility] pt with decreased appetite PTA. Limited documentation since admission, however 10% of breakfast eaten yesterday per documentation.   Medications: colace  Electrolyte/Renal Profile and Glucose Profile:   Recent Labs Lab 12/27/14 1350 12/28/14 0647  NA 136 141  K 4.4 4.0  CL 99* 107  CO2 29 27  BUN 14 17  CREATININE 1.10* 0.93  CALCIUM 8.7* 8.2*  GLUCOSE 137* 84   Protein Profile:   Recent Labs Lab 12/27/14 1350  ALBUMIN 2.9*    Gastrointestinal Profile: Last BM:  unknown   Nutrition-Focused Physical Exam Findings:  Unable to complete Nutrition-Focused physical exam at this time.    Weight Change: Per [facility] pt with 5% weight loss in one week, however only 3% in 2 months.  Skin:  Reviewed,  no issues   Height:   Ht Readings from Last 1 Encounters:  12/27/14 5' (1.524 m)    Weight:   Wt Readings from Last 1 Encounters:  12/27/14 103 lb 2.8 oz (46.8 kg)   Wt Readings from Last 10 Encounters:  12/27/14 103 lb 2.8 oz (46.8 kg)  12/21/14 108 lb 9.6 oz (49.261 kg)  10/23/14 106 lb (48.081 kg)    BMI:  Body mass index is 20.15 kg/(m^2).  Estimated Nutritional Needs:   Kcal:  BEE: 800kcals, TEE: (IF 1.1-1.3)(AF 1.2) 1055-1247kcals  Protein:  46-56g protein (1.0-1.2g/kg)  Fluid:  1170-1469mL of fluid (25-42mL/kg)  EDUCATION NEEDS:   No education needs identified at this time   MODERATE Care Level  Leda Quail, RD, LDN Pager [phone]

## 2014-12-29 NOTE — Care Management Important Message (Signed)
Important Message  Patient Details  Name: Alexis Boyle MRN: [id number] Date of Birth: Jan 02, 1920   Medicare Important Message Given:  Yes-second notification given    Gwenette Greet, RN 12/29/2014, 1:27 PM

## 2014-12-29 NOTE — Care Management (Signed)
Admitted to this facility with the diagnosis of acute respiratory distress. Discharged from this facility 12/21/14. Lives with daughter, Jacinta Shoe. Daughter takes care of her mother with the help of husband 24/7.  Last in Dr. Ewell Poe office 2 months ago. No skilled facility. Uses rolling walker and wheelchair. Will be followed by [facility] in the home. Falls in the home. Fair appetite. Physical therapy evaluation completed. Recommends skilled nursing facility. Daughter declines this plan at this time.  Discharge to home per Dr. Juliene Pina. Family will transport. Gwenette Greet RN MSN Care Management [phone]

## 2014-12-29 NOTE — Plan of Care (Signed)
Pt w/alzheimer's admitted for acute resp failure [facility].  Was on IV abx and switched to Ceftin at d/c.  D/ced home w/home health even though PT recommended SNF.  Home is family's preference. Pt's O2 sats = 100% on RA.  No c/o pain.  Daughter will be taking pt home.

## 2015-01-01 LAB — CULTURE, BLOOD (ROUTINE X 2)
CULTURE: NO GROWTH
Culture: NO GROWTH

## 2017-01-03 IMAGING — CT CT HEAD W/O CM
1 series · 16 of 30 positions shown, 20 images · non-contrast
Comparison: 10/23/2014

CLINICAL DATA: Seizure-like episode.  Shaking and weakness.

EXAM:
CT HEAD WITHOUT CONTRAST
TECHNIQUE: Contiguous axial images were obtained from the base of the skull
through the vertex without intravenous contrast.

[Series 2: head wo · axial · 0.43mm/px · z∈[-187,-61]mm · 16 of 32 slices shown, 20 images]
[im 2/32  brain]
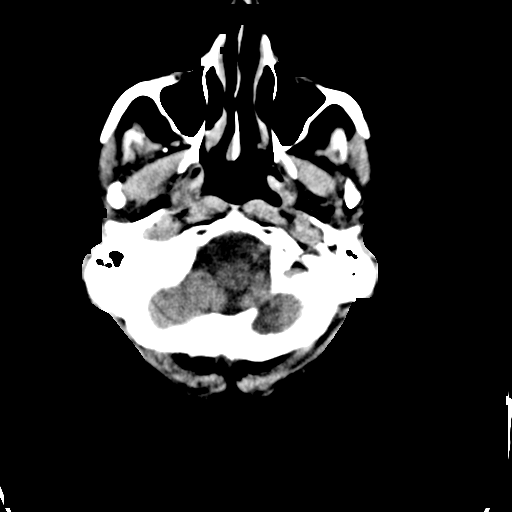
[im 2/32  bone]
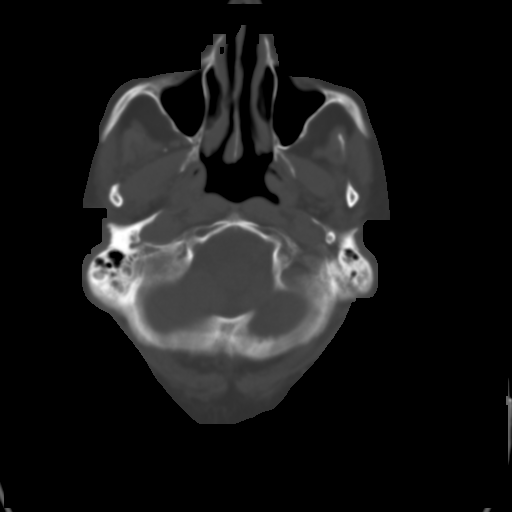
[im 4/32  brain]
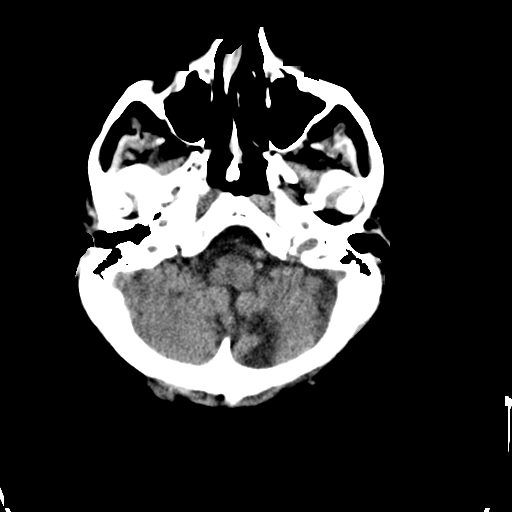
[im 6/32  brain]
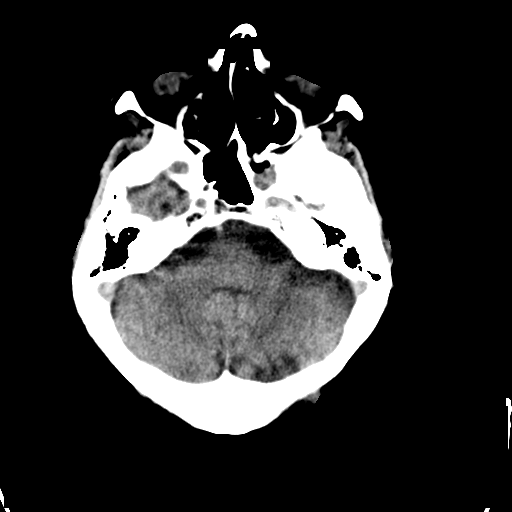
[im 8/32  brain]
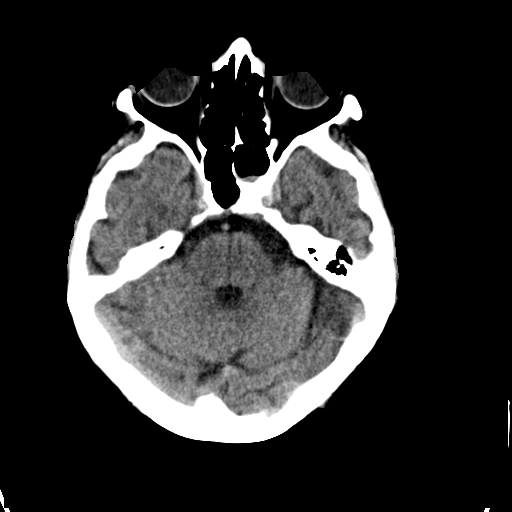
[im 9/32  brain]
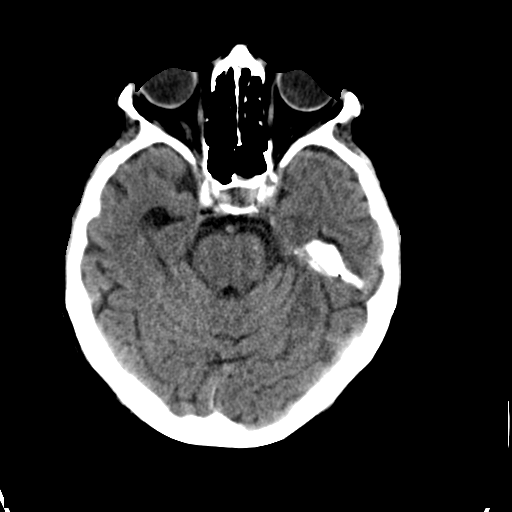
[im 9/32  bone]
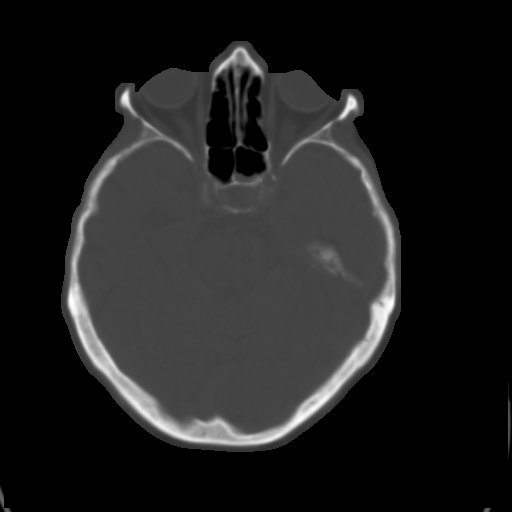
[im 11/32  brain]
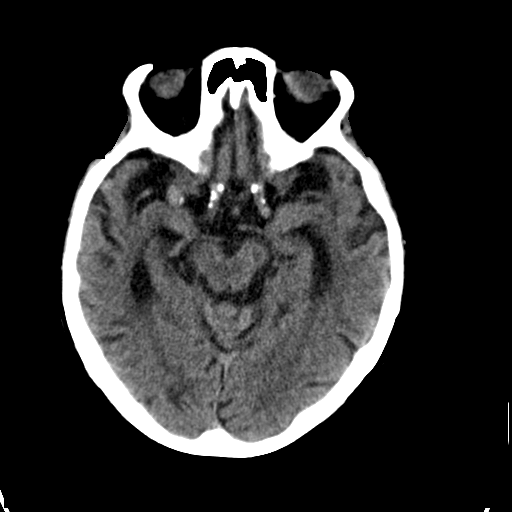
[im 13/32  brain]
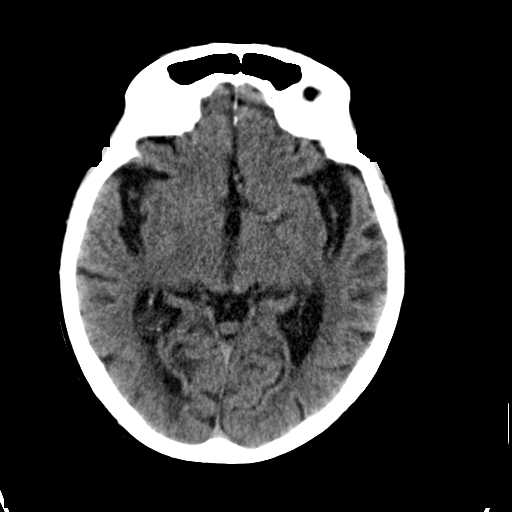
[im 15/32  brain]
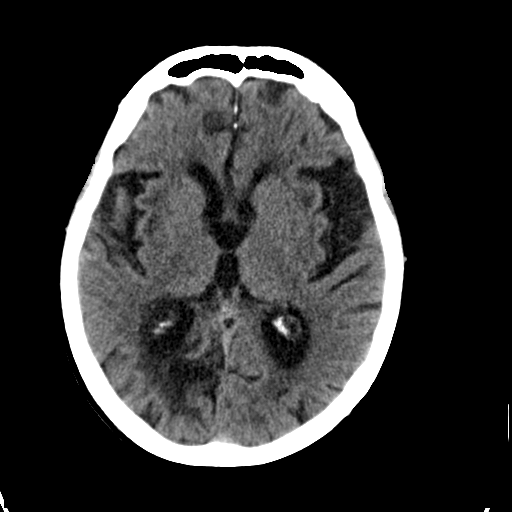
[im 17/32  brain]
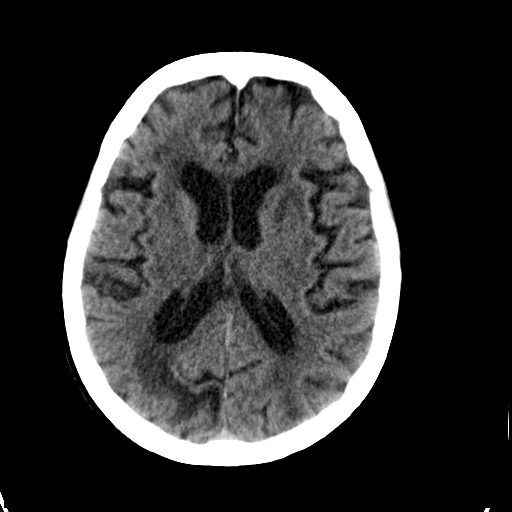
[im 17/32  bone]
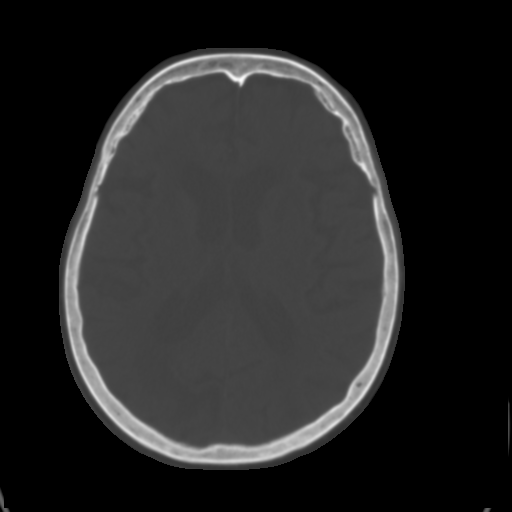
[im 19/32  brain]
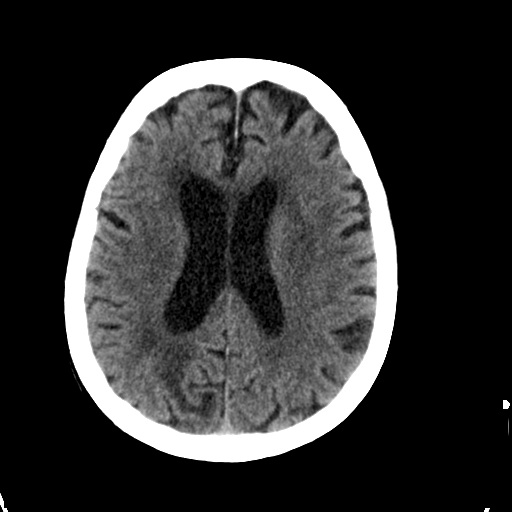
[im 21/32  brain]
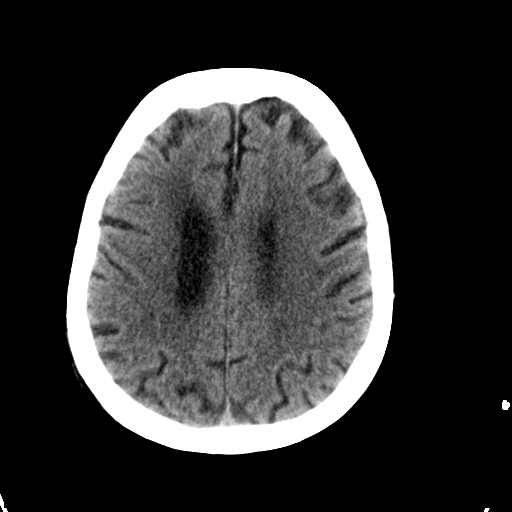
[im 23/32  brain]
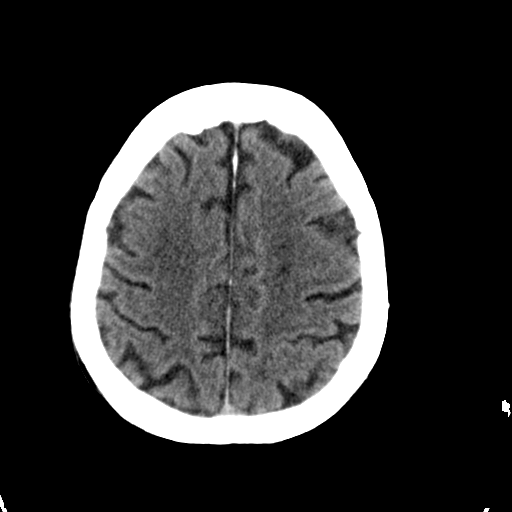
[im 24/32  brain]
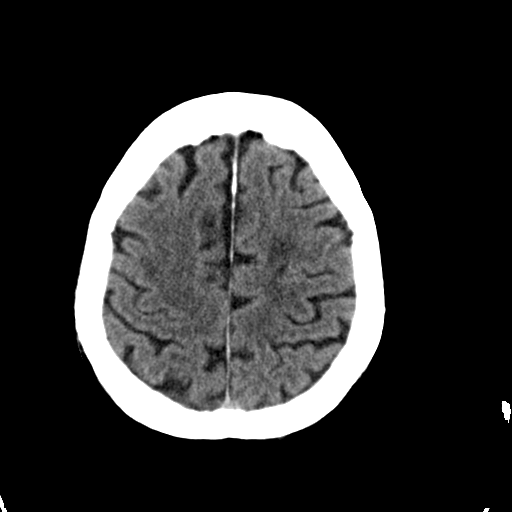
[im 24/32  bone]
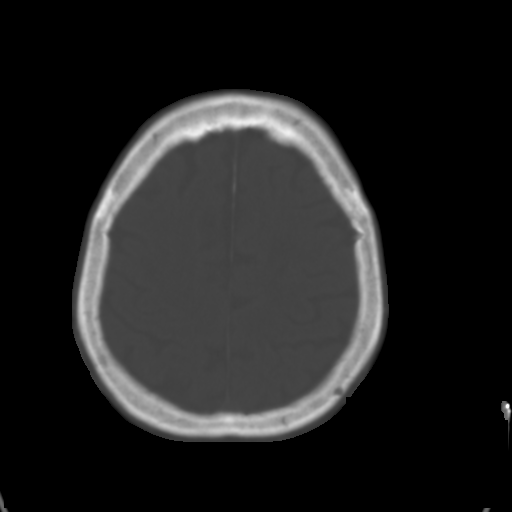
[im 26/32  brain]
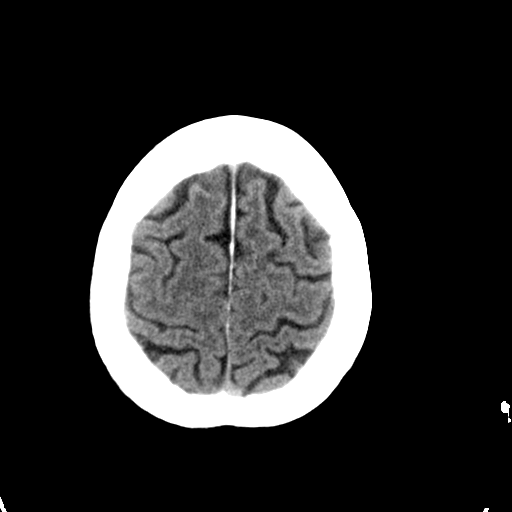
[im 28/32  brain]
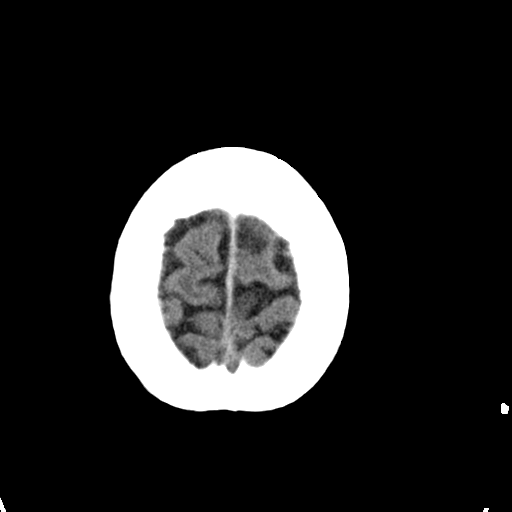
[im 30/32  brain]
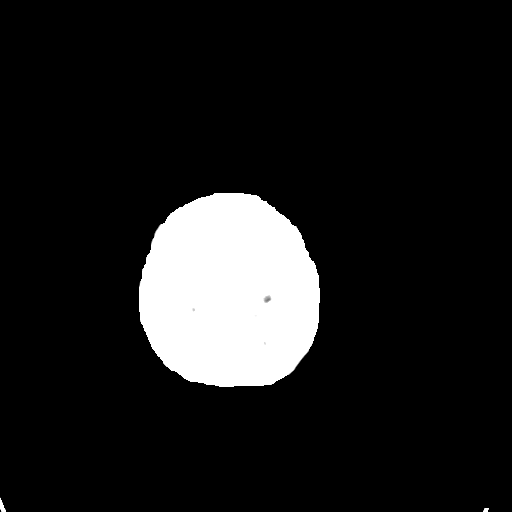

[16 of 30 positions shown; findings below may reference images not displayed]

FINDINGS: Skull and Sinuses:No notable swelling.  No evidence of fracture.

Left sphenoid sinus effusion.

Orbits: No traumatic finding.  Bilateral cataract resection.

Brain: No evidence of acute infarction, hemorrhage, hydrocephalus,
or mass lesion/mass effect.

Extensive chronic small vessel disease with multiple remote lacunar
infarcts, most discrete in the right thalamus and left caudate head.
Remote cortical and subcortical infarct in the parasagittal right
occipital lobe.

Visible right MCA trifurcation aneurysm. No subarachnoid hemorrhage
or brain edema.
IMPRESSION: 1. No acute finding.
2. Extensive remote ischemic injury, including the right occipital
cortex.
3. Known right MCA bifurcation aneurysm.
4. Left sphenoid sinus effusion.

## 2017-01-10 IMAGING — CR DG CHEST 1V PORT
1 series · 1 of 1 positions shown · non-contrast
Comparison: 12/20/2014

CLINICAL DATA: Difficulty breathing.

EXAM:
PORTABLE CHEST 1 VIEW

[ap]
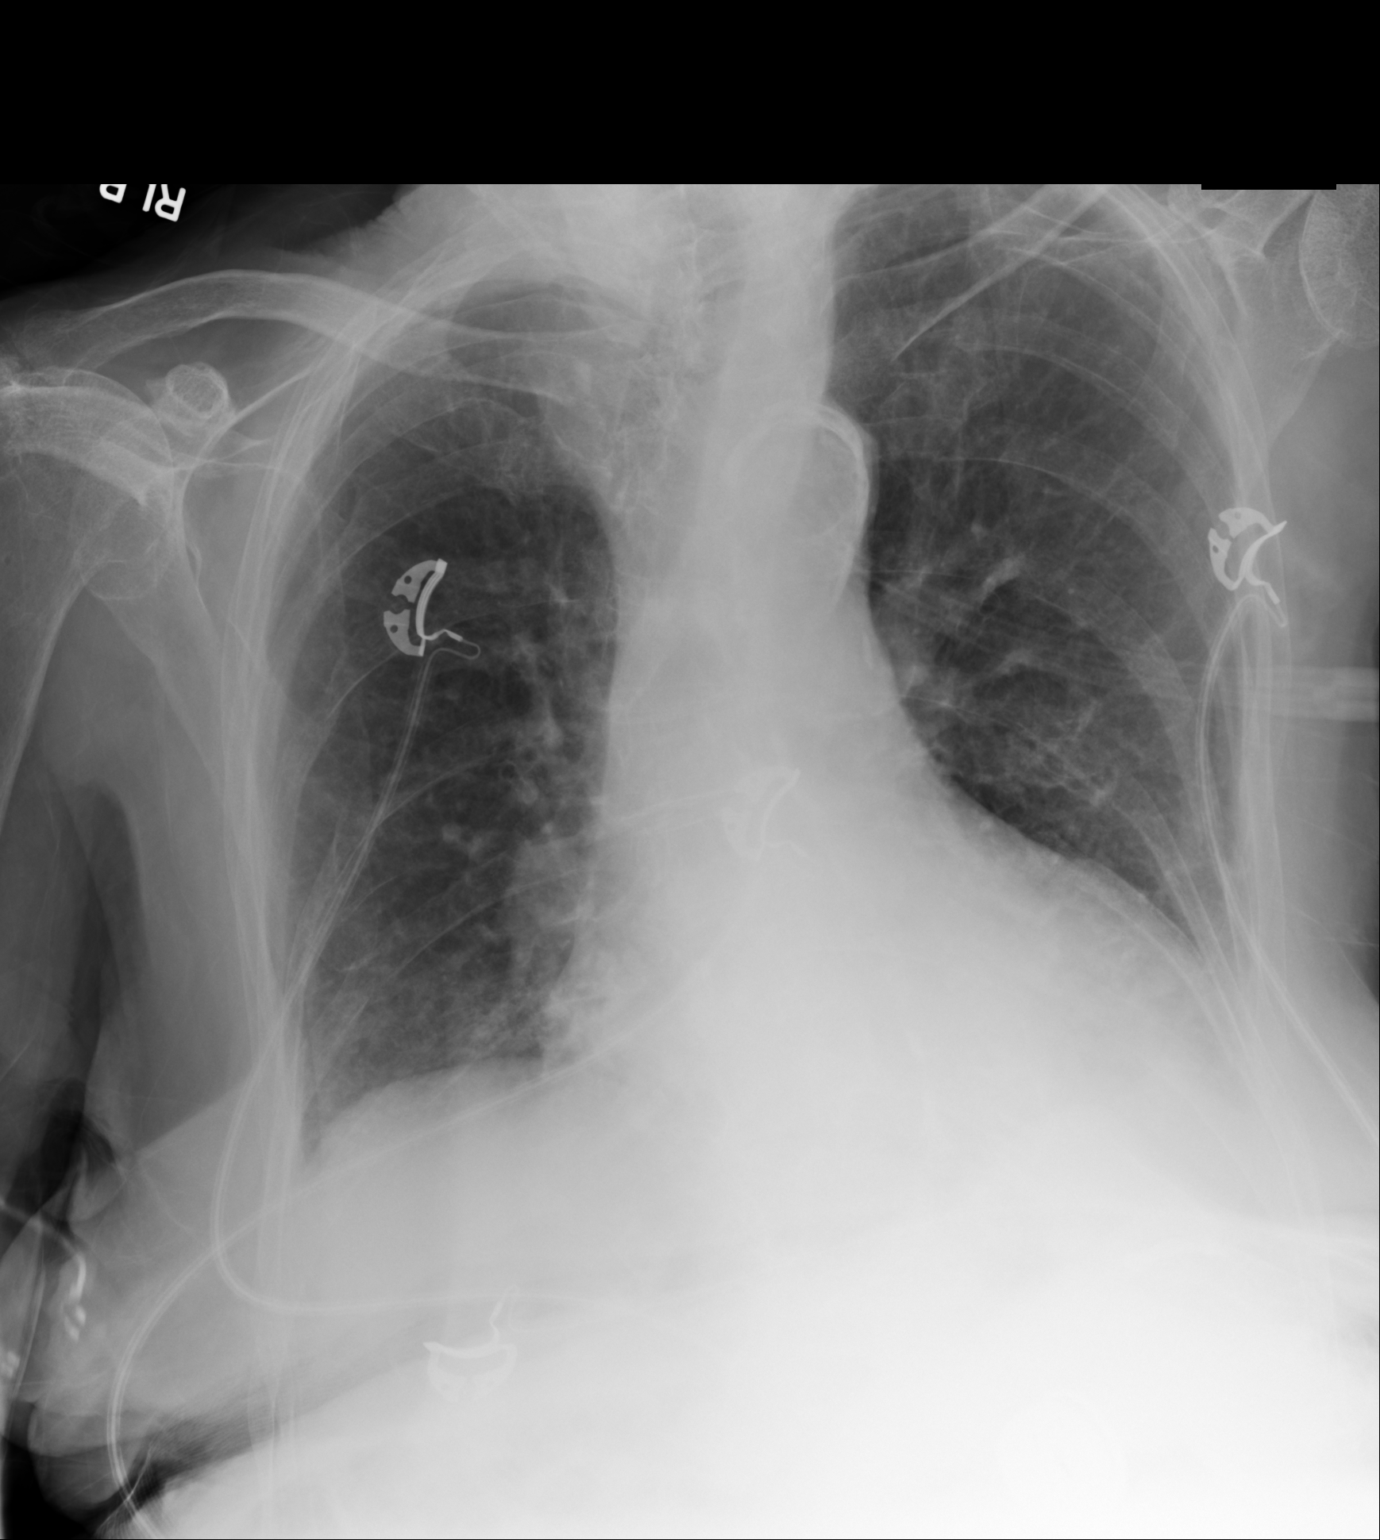

[1 of 1 positions shown; findings below may reference images not displayed]

FINDINGS: Cardiomediastinal silhouette is mildly enlarged. Mediastinal
contours appear intact, considering portable technique.
Calcifications of the thoracic aorta are seen.

There is no evidence of focal airspace consolidation, or
pneumothorax. There may be bilateral pleural effusions.

Osseous structures are without acute abnormality. Soft tissues are
grossly normal.
IMPRESSION: Stably enlarged cardiac silhouette.

Bilateral pleural effusions.

No radiographic evidence of frank pulmonary edema.

## 2017-06-24 DEATH — deceased
